# Patient Record
Sex: Female | Born: 2000 | Race: Black or African American | Hispanic: No | Marital: Single | Smoking: Never smoker
Health system: Southern US, Community
[De-identification: ages and names within clinical notes are randomized; demographics above are authoritative.]

## PROBLEM LIST (undated history)

## (undated) ENCOUNTER — Ambulatory Visit: Admission: EM | Payer: BC Managed Care – PPO | Source: Home / Self Care

## (undated) ENCOUNTER — Inpatient Hospital Stay (HOSPITAL_COMMUNITY): Payer: Self-pay

## (undated) DIAGNOSIS — A599 Trichomoniasis, unspecified: Secondary | ICD-10-CM

## (undated) DIAGNOSIS — Z789 Other specified health status: Secondary | ICD-10-CM

## (undated) HISTORY — DX: Other specified health status: Z78.9

## (undated) HISTORY — DX: Trichomoniasis, unspecified: A59.9

## (undated) HISTORY — PX: NO PAST SURGERIES: SHX2092

---
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
## 2018-07-26 ENCOUNTER — Encounter (HOSPITAL_COMMUNITY): Payer: Self-pay | Admitting: *Deleted

## 2018-07-26 ENCOUNTER — Other Ambulatory Visit: Payer: Self-pay

## 2018-07-26 ENCOUNTER — Emergency Department (HOSPITAL_COMMUNITY)
Admission: EM | Admit: 2018-07-26 | Discharge: 2018-07-27 | Disposition: A | Discharge status: Home / Self Care | Payer: BC Managed Care – PPO | Attending: Emergency Medicine | Admitting: Emergency Medicine

## 2018-07-26 DIAGNOSIS — Z20828 Contact with and (suspected) exposure to other viral communicable diseases: Secondary | ICD-10-CM | POA: Diagnosis not present

## 2018-07-26 DIAGNOSIS — J029 Acute pharyngitis, unspecified: Secondary | ICD-10-CM | POA: Insufficient documentation

## 2018-07-26 DIAGNOSIS — F1290 Cannabis use, unspecified, uncomplicated: Secondary | ICD-10-CM | POA: Diagnosis not present

## 2018-07-26 DIAGNOSIS — R51 Headache: Secondary | ICD-10-CM | POA: Diagnosis present

## 2018-07-26 DIAGNOSIS — R519 Headache, unspecified: Secondary | ICD-10-CM

## 2018-07-26 NOTE — ED Triage Notes (Signed)
Pt reports she hasn't been feeling well since last night, has had a headache and sore throat which have improved.

## 2018-07-27 LAB — SARS CORONAVIRUS 2 BY RT PCR (HOSPITAL ORDER, PERFORMED IN ~~LOC~~ HOSPITAL LAB): SARS Coronavirus 2: NEGATIVE

## 2018-07-27 MED ORDER — DEXAMETHASONE 4 MG PO TABS
10.0000 mg | ORAL_TABLET | Freq: Once | ORAL | Status: AC
  Administered 2018-07-27: 10 mg via ORAL
  Filled 2018-07-27: qty 3

## 2018-07-27 MED ORDER — KETOROLAC TROMETHAMINE 60 MG/2ML IM SOLN
30.0000 mg | Freq: Once | INTRAMUSCULAR | Status: DC
  Filled 2018-07-27: qty 2

## 2018-07-27 MED ORDER — ACETAMINOPHEN 500 MG PO TABS
1000.0000 mg | ORAL_TABLET | Freq: Once | ORAL | Status: AC
  Administered 2018-07-27: 01:00:00 1000 mg via ORAL
  Filled 2018-07-27: qty 2

## 2018-07-27 NOTE — ED Notes (Signed)
Pt unable to tolerate covid swab. Requested I stop. Refuses testing at this time.

## 2018-07-27 NOTE — ED Provider Notes (Signed)
Emergency Department Provider Note   I have reviewed the triage vital signs and the nursing notes.   HISTORY  Chief Complaint Headache   HPI Kelly Meadows is a 18 y.o. female who presents emergency room today with a headache.  Patient states that this is been on for about a day she is also had a fever and general malaise.  Has been exposed with coronavirus that she knows of.  She also had a sore throat is been coming and going.  No other associated symptoms.   No other associated or modifying symptoms.    History reviewed. No pertinent past medical history.  There are no active problems to display for this patient.   History reviewed. No pertinent surgical history.  Current Outpatient Rx  . Order #: [id number] Class: Print    Allergies Patient has no known allergies.  No family history on file.  Social History Social History   Tobacco Use  . Smoking status: Never Smoker  Substance Use Topics  . Alcohol use: No  . Drug use: Yes    Types: Marijuana    Review of Systems  All other systems negative except as documented in the HPI. All pertinent positives and negatives as reviewed in the HPI. ____________________________________________   PHYSICAL EXAM:  VITAL SIGNS: ED Triage Vitals  Enc Vitals Group     BP 07/26/18 2137 (!) 129/92     Pulse Rate 07/26/18 2137 (!) 135     Resp 07/26/18 2137 18     Temp 07/26/18 2137 (!) 101.8 F (38.8 C)     Temp Source 07/26/18 2137 Oral     SpO2 07/26/18 2137 100 %    Constitutional: Alert and oriented. Well appearing and in no acute distress. Eyes: Conjunctivae are normal. PERRL. EOMI. Head: Atraumatic. Nose: No congestion/rhinnorhea. Mouth/Throat: Mucous membranes are moist.  Oropharynx erythematous with some tonsillar exudates. Neck: No stridor.  No meningeal signs. Cervical lympahdenopathy.   Cardiovascular: tachycardic rate, regular rhythm. Good peripheral circulation. Grossly normal heart sounds.    Respiratory: Normal respiratory effort.  No retractions. Lungs CTAB. Gastrointestinal: Soft and nontender. No distention.  Musculoskeletal: No lower extremity tenderness nor edema. No gross deformities of extremities. Neurologic:  Normal speech and language. No gross focal neurologic deficits are appreciated.  Skin:  Skin is warm, dry and intact. No rash noted.  ____________________________________________   LABS (all labs ordered are listed, but only abnormal results are displayed)  Labs Reviewed  CULTURE, GROUP A STREP [facility])  SARS CORONAVIRUS 2 (HOSPITAL ORDER, [facility])  GROUP A STREP BY PCR   ____________________________________________    INITIAL IMPRESSION / ASSESSMENT AND PLAN / ED COURSE  [name] was evaluated in Emergency Department on 07/27/2018 for the symptoms described in the history of present illness. She was evaluated in the context of the global COVID-19 pandemic, which necessitated consideration that the patient might be at risk for infection with the SARS-CoV-2 virus that causes COVID-19. Institutional protocols and algorithms that pertain to the evaluation of patients at risk for COVID-19 are in a state of rapid change based on information released by regulatory bodies including the CDC and federal and state organizations. These policies and algorithms were followed during the patient's care in the ED.  Strep vs Covid. Test for both. Symptomatic care.   Persistently asymptomatic, refusing further care. VS WNL. Stable for discharge.   Pertinent labs & imaging results that were available during my care of the patient were  reviewed by me and considered in my medical decision making (see chart for details).   A medical screening exam was performed and I feel the patient has had an appropriate workup for their chief complaint at this time and likelihood of emergent condition existing is low. They have been counseled on decision,  discharge, follow up and which symptoms necessitate immediate return to the emergency department. They or their family verbally stated understanding and agreement with plan and discharged in stable condition.   ____________________________________________  FINAL CLINICAL IMPRESSION(S) / ED DIAGNOSES  Final diagnoses:  None     MEDICATIONS GIVEN DURING THIS VISIT:  Medications  ketorolac (TORADOL) injection 30 mg (30 mg Intramuscular Refused 07/27/18 0402)  acetaminophen (TYLENOL) tablet 1,000 mg (1,000 mg Oral Given 07/27/18 0047)  dexamethasone (DECADRON) tablet 10 mg (10 mg Oral Given 07/27/18 0338)     NEW OUTPATIENT MEDICATIONS STARTED DURING THIS VISIT:  New Prescriptions   No medications on file    Note:  This note was prepared with assistance of Dragon voice recognition software. Occasional wrong-word or sound-a-like substitutions may have occurred due to the inherent limitations of voice recognition software.   Tristram Milian, Barbara CowerJason, MD 07/27/18 [phone]

## 2018-07-28 LAB — CULTURE, GROUP A STREP (THRC)

## 2018-07-29 ENCOUNTER — Telehealth: Payer: Self-pay

## 2018-07-29 NOTE — Telephone Encounter (Signed)
Post ED Visit - Positive Culture Follow-up: Successful Patient Follow-Up  Culture assessed and recommendations reviewed by:  []  Elenor Quinones, Pharm.D. []  Heide Guile, Pharm.D., BCPS AQ-ID [x]  Parks Neptune, Pharm.D., BCPS []  Alycia Rossetti, Pharm.D., BCPS []  [place], Pharm.D., BCPS, AAHIVP []  Legrand Como, Pharm.D., BCPS, AAHIVP []  Salome Arnt, PharmD, BCPS []  Johnnette Gourd, PharmD, BCPS []  Hughes Better, PharmD, BCPS []  Leeroy Cha, PharmD  Positive strep culture  [x]  Patient discharged without antimicrobial prescription and treatment is now indicated []  Organism is resistant to prescribed ED discharge antimicrobial []  Patient with positive blood cultures  Changes discussed with ED provider: Lenn Sink PA New antibiotic prescription amoxicillin 500 mg PO BID x 10 days Called to [facility] [phone]  Contacted patient, date 07/29/2018, time 1210   Kelly Meadows, Carolynn Comment 07/29/2018, 12:09 PM

## 2018-07-29 NOTE — Progress Notes (Signed)
ED Antimicrobial Stewardship Positive Culture Follow Up   Kelly Meadows is an 18 y.o. female who presented to [facility] on 07/26/2018 with a chief complaint of  Chief Complaint  Patient presents with  . Headache    Recent Results (from the past 720 hour(s))  Culture, group A strep     Status: None   Collection Time: 07/26/18 11:34 PM   Specimen: Throat  Result Value Ref Range Status   Specimen Description THROAT  Final   Special Requests   Final    NONE Performed at [facility], [address], [place], [place] [address]    Culture MODERATE GROUP A STREP (S.PYOGENES) ISOLATED  Final   Report Status 07/28/2018 FINAL  Final  SARS Coronavirus 2 (CEPHEID - Performed in [place] hospital lab), Hosp Order     Status: None   Collection Time: 07/27/18  4:03 AM   Specimen: Nasopharyngeal Swab  Result Value Ref Range Status   SARS Coronavirus 2 NEGATIVE NEGATIVE Final    Comment: (NOTE) If result is NEGATIVE SARS-CoV-2 target nucleic acids are NOT DETECTED. The SARS-CoV-2 RNA is generally detectable in upper and lower  respiratory specimens during the acute phase of infection. The lowest  concentration of SARS-CoV-2 viral copies this assay can detect is 250  copies / mL. A negative result does not preclude SARS-CoV-2 infection  and should not be used as the sole basis for treatment or other  patient management decisions.  A negative result may occur with  improper specimen collection / handling, submission of specimen other  than nasopharyngeal swab, presence of viral mutation(s) within the  areas targeted by this assay, and inadequate number of viral copies  (<250 copies / mL). A negative result must be combined with clinical  observations, patient history, and epidemiological information. If result is POSITIVE SARS-CoV-2 target nucleic acids are DETECTED. The SARS-CoV-2 RNA is generally detectable in upper and lower  respiratory specimens dur ing the acute phase of  infection.  Positive  results are indicative of active infection with SARS-CoV-2.  Clinical  correlation with patient history and other diagnostic information is  necessary to determine patient infection status.  Positive results do  not rule out bacterial infection or co-infection with other viruses. If result is PRESUMPTIVE POSTIVE SARS-CoV-2 nucleic acids MAY BE PRESENT.   A presumptive positive result was obtained on the submitted specimen  and confirmed on repeat testing.  While 2019 novel coronavirus  (SARS-CoV-2) nucleic acids may be present in the submitted sample  additional confirmatory testing may be necessary for epidemiological  and / or clinical management purposes  to differentiate between  SARS-CoV-2 and other Sarbecovirus currently known to infect humans.  If clinically indicated additional testing with an alternate test  methodology [phone]) is advised. The SARS-CoV-2 RNA is generally  detectable in upper and lower respiratory sp ecimens during the acute  phase of infection. The expected result is Negative. Fact Sheet for Patients:  StrictlyIdeas.no Fact Sheet for Healthcare Providers: BankingDealers.co.za This test is not yet approved or cleared by the [place] FDA and has been authorized for detection and/or diagnosis of SARS-CoV-2 by FDA under an Emergency Use Authorization (EUA).  This EUA will remain in effect (meaning this test can be used) for the duration of the COVID-19 declaration under Section 564(b)(1) of the Act, 21 U.S.C. section 360bbb-3(b)(1), unless the authorization is terminated or revoked sooner. Performed at [facility], [place] [address] [place], [place] [address]    []   Patient discharged originally without antimicrobial agent and treatment is now indicated  New antibiotic prescription: amox 500 bid x 10 d  ED Provider:  jeff hedges,pa-c  Kelly Meadows 07/29/2018, 11:28  AM Clinical Pharmacist Monday - Friday phone -  [phone] Saturday - Sunday phone - [phone]

## 2018-08-09 ENCOUNTER — Other Ambulatory Visit: Payer: Self-pay

## 2018-08-09 ENCOUNTER — Emergency Department (HOSPITAL_COMMUNITY)
Admission: EM | Admit: 2018-08-09 | Discharge: 2018-08-09 | Disposition: A | Discharge status: Home / Self Care | Payer: BC Managed Care – PPO | Attending: Emergency Medicine | Admitting: Emergency Medicine

## 2018-08-09 DIAGNOSIS — R109 Unspecified abdominal pain: Secondary | ICD-10-CM | POA: Diagnosis present

## 2018-08-09 DIAGNOSIS — R1084 Generalized abdominal pain: Secondary | ICD-10-CM | POA: Insufficient documentation

## 2018-08-09 DIAGNOSIS — N926 Irregular menstruation, unspecified: Secondary | ICD-10-CM | POA: Diagnosis not present

## 2018-08-09 LAB — URINALYSIS, ROUTINE W REFLEX MICROSCOPIC
Bilirubin Urine: NEGATIVE
Glucose, UA: NEGATIVE mg/dL
Hgb urine dipstick: NEGATIVE
Ketones, ur: NEGATIVE mg/dL
Leukocytes,Ua: NEGATIVE
Nitrite: NEGATIVE
Protein, ur: NEGATIVE mg/dL
Specific Gravity, Urine: 1.028 (ref 1.005–1.030)
pH: 5 (ref 5.0–8.0)

## 2018-08-09 LAB — PREGNANCY, URINE: Preg Test, Ur: NEGATIVE

## 2018-08-09 NOTE — ED Provider Notes (Signed)
[facility] Provider Note   CSN: [id number] Arrival date & time: 08/09/18  1810     History   Chief Complaint Chief Complaint  Patient presents with  . Abdominal Pain    HPI Catarina Hartshornrinity A Mckiernan is a 18 y.o. female.     Bell Artemio Aly Bibby is a 18 y.o. female who is otherwise healthy, presents to the emergency department for evaluation of lower abdominal cramps and vaginal bleeding.  Patient reports that about 2 weeks ago she had a normal menstrual cycle and then started having some light bleeding again on 7/22, bleeding stopped this afternoon.  Patient was having some associated lower abdominal cramping which she reports is very similar to her typical period cramps.  She reports that at the end of May she was due for a Depo-Provera shot but chose not to continue with this medication.  She denies any vaginal discharge.  Bleeding has completely stopped.  No dysuria or urinary frequency.  No nausea, vomiting, constipation or diarrhea.  Cramping has resolved with vaginal bleeding.     No past medical history on file.  There are no active problems to display for this patient.   No past surgical history on file.   OB History   No obstetric history on file.      Home Medications    Prior to Admission medications   Not on File    Family History No family history on file.  Social History Social History   Tobacco Use  . Smoking status: Never Smoker  Substance Use Topics  . Alcohol use: No  . Drug use: Yes    Types: Marijuana     Allergies   Patient has no known allergies.   Review of Systems Review of Systems  Constitutional: Negative for chills and fever.  HENT: Negative.   Respiratory: Negative for cough and shortness of breath.   Cardiovascular: Negative for chest pain.  Gastrointestinal: Positive for abdominal pain. Negative for constipation, diarrhea, nausea and vomiting.  Genitourinary: Positive for vaginal bleeding.  Negative for dysuria, frequency, hematuria, pelvic pain and vaginal discharge.  Musculoskeletal: Negative for arthralgias and myalgias.  Skin: Negative for color change and rash.  Neurological: Negative for dizziness, syncope and light-headedness.     Physical Exam Updated Vital Signs BP 118/73 (BP Location: Right Arm)   Pulse 87   Temp 97.7 F (36.5 C) (Oral)   Resp 16   Ht 5\' 5"  (1.651 m)   Wt 68.9 kg   LMP 07/25/2018   SpO2 100%   BMI 25.29 kg/m   Physical Exam Vitals signs and nursing note reviewed.  Constitutional:      General: She is not in acute distress.    Appearance: She is well-developed and normal weight. She is not diaphoretic.  HENT:     Head: Normocephalic and atraumatic.     Mouth/Throat:     Mouth: Mucous membranes are moist.     Pharynx: Oropharynx is clear.  Eyes:     General:        Right eye: No discharge.        Left eye: No discharge.     Pupils: Pupils are equal, round, and reactive to light.  Neck:     Musculoskeletal: Neck supple.  Cardiovascular:     Rate and Rhythm: Normal rate and regular rhythm.     Heart sounds: Normal heart sounds.  Pulmonary:     Effort: Pulmonary effort is normal. No respiratory distress.  Breath sounds: Normal breath sounds. No wheezing or rales.     Comments: Respirations equal and unlabored, patient able to speak in full sentences, lungs clear to auscultation bilaterally Abdominal:     General: Abdomen is flat. Bowel sounds are normal. There is no distension.     Palpations: Abdomen is soft. There is no mass.     Tenderness: There is no abdominal tenderness. There is no guarding.     Comments: Abdomen soft, nondistended, nontender to palpation in all quadrants without guarding or peritoneal signs, no CVA tenderness  Genitourinary:    Comments: Pelvic deferred as symptoms have completely resolved Musculoskeletal:        General: No deformity.  Skin:    General: Skin is warm and dry.     Capillary Refill:  Capillary refill takes less than 2 seconds.  Neurological:     Mental Status: She is alert.     Coordination: Coordination normal.     Comments: Speech is clear, able to follow commands Moves extremities without ataxia, coordination intact  Psychiatric:        Mood and Affect: Mood normal.        Behavior: Behavior normal.      ED Treatments / Results  Labs (all labs ordered are listed, but only abnormal results are displayed) Labs Reviewed  URINALYSIS, ROUTINE W REFLEX MICROSCOPIC  PREGNANCY, URINE    EKG None  Radiology No results found.  Procedures Procedures (including critical care time)  Medications Ordered in ED Medications - No data to display   Initial Impression / Assessment and Plan / ED Course  I have reviewed the triage vital signs and the nursing notes.  Pertinent labs & imaging results that were available during my care of the patient were reviewed by me and considered in my medical decision making (see chart for details).  Patient presents after she had recurrent episode of vaginal bleeding about 2 weeks after her menstrual cycle, she had light bleeding for 3 days with associated lower abdominal cramping which is typical for her periods.  Patient recently missed Depo-Provera injection at the end of May, she decided she wanted to discontinue this medication and I suspect this is the cause for her irregular periods.  Symptoms have completely resolved as bleeding stopped earlier today, she has normal vitals and no focal tenderness on exam.  She is not having any other associated symptoms.  Urine pregnancy and urinalysis are negative today.  Feel patient is stable for discharge home without further evaluation, will have her follow-up with her OB/GYN to discuss other options for contraception.  Patient expresses understanding and agreement with this plan.  Discharged home in good condition.  Final Clinical Impressions(s) / ED Diagnoses   Final diagnoses:   Abdominal cramps  Irregular periods    ED Discharge Orders    None       Janet Berlin 08/09/18 2008    Quintella Reichert, MD 08/10/18 1056

## 2018-08-09 NOTE — ED Notes (Signed)
Patient verbalizes understanding of discharge instructions. Opportunity for questioning and answers were provided. Armband removed by staff, pt discharged from ED ambulatory.   

## 2018-08-09 NOTE — ED Triage Notes (Signed)
Pt c/o stomach cramps.  Stated she had a period 2 weeks and started bleeding again 08/06/18.  No active bleeding per pt at this time

## 2018-08-09 NOTE — ED Notes (Signed)
P:t refusing discharge vitals or to sign electronic discharge. Standing at nurses desk stating "I just want to go."

## 2018-08-09 NOTE — Discharge Instructions (Signed)
After you have stopped Depo-Provera this can cause some irregular periods and increased cramping.  Please follow-up with your OB/GYN clinic to discuss continuing Depo-Provera versus other options for birth control.  Your pregnancy test was negative today and I so no evidence of UTI.  Return for new or worsening symptoms.

## 2018-10-29 NOTE — Telephone Encounter (Signed)
This encounter was created in error - please disregard.

## 2019-06-30 ENCOUNTER — Other Ambulatory Visit: Payer: Self-pay

## 2019-06-30 ENCOUNTER — Encounter (HOSPITAL_COMMUNITY): Payer: Self-pay | Admitting: Emergency Medicine

## 2019-06-30 ENCOUNTER — Emergency Department (HOSPITAL_COMMUNITY)
Admission: EM | Admit: 2019-06-30 | Discharge: 2019-07-01 | Discharge status: Against Medical Advice | Payer: BC Managed Care – PPO | Attending: Emergency Medicine | Admitting: Emergency Medicine

## 2019-06-30 DIAGNOSIS — Z041 Encounter for examination and observation following transport accident: Secondary | ICD-10-CM | POA: Insufficient documentation

## 2019-06-30 DIAGNOSIS — Z5321 Procedure and treatment not carried out due to patient leaving prior to being seen by health care provider: Secondary | ICD-10-CM | POA: Diagnosis not present
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
## 2019-07-01 ENCOUNTER — Encounter (HOSPITAL_COMMUNITY): Payer: Self-pay

## 2019-07-01 ENCOUNTER — Other Ambulatory Visit: Payer: Self-pay

## 2019-07-01 ENCOUNTER — Ambulatory Visit (INDEPENDENT_AMBULATORY_CARE_PROVIDER_SITE_OTHER)
Admission: RE | Admit: 2019-07-01 | Discharge: 2019-07-01 | Disposition: A | Discharge status: Home / Self Care | Payer: BC Managed Care – PPO | Source: Ambulatory Visit

## 2019-07-01 VITALS — BP 120/69 | HR 88 | Temp 99.1°F | Resp 16

## 2019-07-01 DIAGNOSIS — M546 Pain in thoracic spine: Secondary | ICD-10-CM

## 2019-07-01 DIAGNOSIS — M542 Cervicalgia: Secondary | ICD-10-CM

## 2019-07-01 DIAGNOSIS — M25572 Pain in left ankle and joints of left foot: Secondary | ICD-10-CM

## 2019-07-01 MED ORDER — TIZANIDINE HCL 4 MG PO TABS: 4.0000 mg | ORAL_TABLET | Freq: Three times a day (TID) | ORAL | 0 refills | Status: DC | PRN

## 2019-07-01 MED ORDER — MELOXICAM 7.5 MG PO TABS: 7.5000 mg | ORAL_TABLET | Freq: Every day | ORAL | 0 refills | Status: DC
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
## 2019-08-24 ENCOUNTER — Emergency Department (HOSPITAL_COMMUNITY)
Admission: EM | Admit: 2019-08-24 | Discharge: 2019-08-25 | Disposition: A | Discharge status: Home / Self Care | Payer: BC Managed Care – PPO

## 2020-02-18 ENCOUNTER — Ambulatory Visit: Payer: BC Managed Care – PPO | Admitting: Physician Assistant

## 2020-03-31 ENCOUNTER — Encounter: Payer: Self-pay | Admitting: Physician Assistant

## 2020-03-31 ENCOUNTER — Ambulatory Visit (INDEPENDENT_AMBULATORY_CARE_PROVIDER_SITE_OTHER): Payer: BC Managed Care – PPO | Admitting: Physician Assistant

## 2020-03-31 VITALS — BP 102/70 | HR 72 | Ht 65.75 in | Wt 146.6 lb

## 2020-03-31 DIAGNOSIS — K644 Residual hemorrhoidal skin tags: Secondary | ICD-10-CM | POA: Diagnosis not present

## 2020-03-31 DIAGNOSIS — K640 First degree hemorrhoids: Secondary | ICD-10-CM

## 2020-03-31 DIAGNOSIS — K625 Hemorrhage of anus and rectum: Secondary | ICD-10-CM

## 2020-03-31 NOTE — Patient Instructions (Signed)
Please purchase over the counter hydrocortisone cream to use rectally twice daily x 2 weeks.   RECTAL CARE INSTRUCTIONS:  1. Sitz Baths twice a day for 10 minutes each. 2. Thoroughly clean and dry the rectum. 3. Put Tucks pad against the rectum at night. 4. Clean the rectum with Balenol lotion after each bowel movement.  Call if your symptoms have not improved and your bleeding persists.   Normal BMI (Body Mass Index- based on height and weight) is between 19 and 25. Your BMI today is Body mass index is 23.84 kg/m. Marland Kitchen Please consider follow up  regarding your BMI with your Primary Care Provider.
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
## 2020-05-07 NOTE — Progress Notes (Signed)
Reviewed and agree with documentation and assessment and plan. K. Veena Kamla Skilton , MD   

## 2020-07-08 ENCOUNTER — Encounter (HOSPITAL_COMMUNITY): Payer: Self-pay

## 2020-07-08 ENCOUNTER — Other Ambulatory Visit: Payer: Self-pay

## 2020-07-08 ENCOUNTER — Emergency Department (HOSPITAL_COMMUNITY)
Admission: EM | Admit: 2020-07-08 | Discharge: 2020-07-09 | Disposition: A | Discharge status: Home / Self Care | Payer: BC Managed Care – PPO | Attending: Emergency Medicine | Admitting: Emergency Medicine

## 2020-07-08 DIAGNOSIS — U071 COVID-19: Secondary | ICD-10-CM | POA: Diagnosis not present

## 2020-07-08 DIAGNOSIS — R0981 Nasal congestion: Secondary | ICD-10-CM

## 2020-07-08 DIAGNOSIS — R059 Cough, unspecified: Secondary | ICD-10-CM | POA: Diagnosis present

## 2020-07-08 NOTE — ED Triage Notes (Signed)
Pt reports congestion x2 days and reports coughing up some mucus today with a little blood in it. Pt denies N/V and abdominal pain.

## 2020-07-09 ENCOUNTER — Emergency Department (HOSPITAL_COMMUNITY): Payer: BC Managed Care – PPO

## 2020-07-09 LAB — RESP PANEL BY RT-PCR (FLU A&B, COVID) ARPGX2
Influenza A by PCR: NEGATIVE
Influenza B by PCR: NEGATIVE
SARS Coronavirus 2 by RT PCR: POSITIVE — AB

## 2020-07-09 NOTE — Discharge Instructions (Addendum)
COVID test should result next few hours.  You will be contacted if positive.  Results will also update into my chart. Can use over the counter medications and/or nasal spray for congestion. Follow-up with your primary care doctor. Return here for new concerns.

## 2020-07-09 NOTE — ED Provider Notes (Signed)
[facility]-EMERGENCY DEPT Provider Note   CSN: [id number] Arrival date & time: 07/08/20  2236     History Chief Complaint  Patient presents with   Cough    Kelly Meadows is a 20 y.o. female.  The history is provided by the patient and medical records.  Cough  19 y.o. F presenting to ED for bloody mucus.  Patient states she has had a lot of nasal congestion over the past 2 days, now having postnasal drip.  States today she was clearing her throat and some of the mucus that came up had a little bit of blood in it.  She is not actively coughing and has not been coughing up any blood.  She denies any chest pain or shortness of breath.  She has not had any fevers.  She denies any known COVID exposures.  History reviewed. No pertinent past medical history.  There are no problems to display for this patient.   History reviewed. No pertinent surgical history.   OB History   No obstetric history on file.     History reviewed. No pertinent family history.  Social History   Tobacco Use   Smoking status: Never   Smokeless tobacco: Never  Vaping Use   Vaping Use: Some days  Substance Use Topics   Alcohol use: Yes    Alcohol/week: 1.0 standard drink    Types: 1 Standard drinks or equivalent per week   Drug use: Not Currently    Types: Marijuana    Comment: Stopped approx 1-2 months ago (03/31/20)    Home Medications Prior to Admission medications   Not on File    Allergies    Patient has no known allergies.  Review of Systems   Review of Systems  Respiratory:  Positive for cough.   All other systems reviewed and are negative.  Physical Exam Updated Vital Signs BP 114/82 (BP Location: Right Arm)   Pulse 70   Temp 98 F (36.7 C) (Oral)   Resp 18   Ht 5\' 5"  (1.651 m)   Wt 68 kg   LMP 06/22/2020   SpO2 96%   BMI 24.96 kg/m   Physical Exam Vitals and nursing note reviewed.  Constitutional:      Appearance: She is well-developed.      Comments: Sounds congested  HENT:     Head: Normocephalic and atraumatic.     Right Ear: Tympanic membrane and ear canal normal.     Left Ear: Tympanic membrane and ear canal normal.     Nose: Mucosal edema, congestion and rhinorrhea present. Rhinorrhea is clear.     Mouth/Throat:     Comments: PND noted, no gross blood in posterior oropharynx Eyes:     Conjunctiva/sclera: Conjunctivae normal.     Pupils: Pupils are equal, round, and reactive to light.  Cardiovascular:     Rate and Rhythm: Normal rate and regular rhythm.     Heart sounds: Normal heart sounds.  Pulmonary:     Effort: Pulmonary effort is normal.     Breath sounds: Normal breath sounds. No wheezing or rhonchi.     Comments: Lungs CTAB Abdominal:     General: Bowel sounds are normal.     Palpations: Abdomen is soft.  Musculoskeletal:        General: Normal range of motion.     Cervical back: Normal range of motion.  Skin:    General: Skin is warm and dry.  Neurological:  Mental Status: She is alert and oriented to person, place, and time.    ED Results / Procedures / Treatments   Labs (all labs ordered are listed, but only abnormal results are displayed) Labs Reviewed  RESP PANEL BY RT-PCR (FLU A&B, COVID) [id number]    EKG None  Radiology DG Chest 2 View  Result Date: 07/09/2020 CLINICAL DATA:  Congestion for 2 days. EXAM: CHEST - 2 VIEW COMPARISON:  None. FINDINGS: The cardiomediastinal contours are normal. The lungs are clear. Pulmonary vasculature is normal. No consolidation, pleural effusion, or pneumothorax. No acute osseous abnormalities are seen. IMPRESSION: Negative radiographs of the chest. Electronically Signed   By: Narda Rutherford M.D.   On: 07/09/2020 00:57    Procedures Procedures   Medications Ordered in ED Medications - No data to display  ED Course  I have reviewed the triage vital signs and the nursing notes.  Pertinent labs & imaging results that were available during my care of  the patient were reviewed by me and considered in my medical decision making (see chart for details).    MDM Rules/Calculators/A&P  20 year old female here with bloody mucus.  Has had a lot of nasal congestion of the past few days, now with postnasal drip.  She clears her throat today and mucus noted to have a little bit of blood in it.  She denies any cough, chest pain, or shortness of breath.  No hemoptysis.  She is afebrile and nontoxic in appearance here.  Vitals are stable.  On exam, she does have significant mucosal edema, irritation, and clear rhinorrhea.  There is some observed postnasal drip but this appears grossly nonbloody.  Her lungs are clear without any wheezes or rhonchi.  Suspect bloody mucus is likely due to inflammation in the nasal passages.  I have low suspicion for acute PE.  Her chest x-ray is normal.  We will send COVID test.  Encouraged symptomatic care at home.  Close follow-up with PCP.  Return here for new concerns.  Final Clinical Impression(s) / ED Diagnoses Final diagnoses:  Nasal congestion    Rx / DC Orders ED Discharge Orders     None        Garlon Hatchet, PA-C 07/09/20 0151    Mesner, Barbara Cower, MD 07/09/20 [phone]

## 2020-07-23 ENCOUNTER — Other Ambulatory Visit: Payer: Self-pay

## 2020-07-23 ENCOUNTER — Emergency Department (HOSPITAL_COMMUNITY)
Admission: EM | Admit: 2020-07-23 | Discharge: 2020-07-23 | Disposition: A | Discharge status: Home / Self Care | Payer: BC Managed Care – PPO | Attending: Emergency Medicine | Admitting: Emergency Medicine

## 2020-07-23 DIAGNOSIS — B9689 Other specified bacterial agents as the cause of diseases classified elsewhere: Secondary | ICD-10-CM | POA: Insufficient documentation

## 2020-07-23 DIAGNOSIS — N760 Acute vaginitis: Secondary | ICD-10-CM | POA: Diagnosis not present

## 2020-07-23 DIAGNOSIS — R350 Frequency of micturition: Secondary | ICD-10-CM | POA: Diagnosis present

## 2020-07-23 DIAGNOSIS — A599 Trichomoniasis, unspecified: Secondary | ICD-10-CM | POA: Diagnosis not present

## 2020-07-23 LAB — URINALYSIS, ROUTINE W REFLEX MICROSCOPIC
Bilirubin Urine: NEGATIVE
Glucose, UA: NEGATIVE mg/dL
Hgb urine dipstick: NEGATIVE
Ketones, ur: NEGATIVE mg/dL
Nitrite: NEGATIVE
Protein, ur: NEGATIVE mg/dL
Specific Gravity, Urine: 1.023 (ref 1.005–1.030)
pH: 6 (ref 5.0–8.0)

## 2020-07-23 LAB — WET PREP, GENITAL
Sperm: NONE SEEN
Yeast Wet Prep HPF POC: NONE SEEN

## 2020-07-23 LAB — PREGNANCY, URINE: Preg Test, Ur: NEGATIVE

## 2020-07-23 MED ORDER — METRONIDAZOLE 500 MG PO TABS: 500.0000 mg | ORAL_TABLET | Freq: Two times a day (BID) | ORAL | 0 refills | Status: DC

## 2020-07-23 MED ORDER — LIDOCAINE HCL (PF) 1 % IJ SOLN
1.0000 mL | Freq: Once | INTRAMUSCULAR | Status: AC
  Administered 2020-07-23: 1 mL
  Filled 2020-07-23: qty 30

## 2020-07-23 MED ORDER — DOXYCYCLINE HYCLATE 100 MG PO TABS: 100.0000 mg | ORAL_TABLET | Freq: Two times a day (BID) | ORAL | 0 refills | Status: AC

## 2020-07-23 MED ORDER — CEFTRIAXONE SODIUM 1 G IJ SOLR
500.0000 mg | Freq: Once | INTRAMUSCULAR | Status: AC
Start: 2020-07-23 — End: 2020-07-23
  Administered 2020-07-23: 500 mg via INTRAMUSCULAR
  Filled 2020-07-23: qty 10

## 2020-07-23 NOTE — ED Triage Notes (Signed)
Pt came in with c/o one episode of hematuria and urinary frequency. Pt also wants to be tested for STD. Pt is denying discharge. Pt endorses random suprapubic abdominal pain. Symptoms started tonight

## 2020-07-23 NOTE — Discharge Instructions (Addendum)
Today your diagnosed with bacterial vaginosis  and Trichomoniasis.  You received a prescription for metronidazole also known as Flagyl. It is very important that you do not consume any alcohol while taking this medication as it will cause you to become violently ill.  You may have diarrhea from the antibiotics.  It is very important that you continue to take the antibiotics even if you get diarrhea unless a medical professional tells you that you may stop taking them.  If you stop too early the bacteria you are being treated for will become stronger and you may need different, more powerful antibiotics that have more side effects and worsening diarrhea.  Please stay well hydrated and consider probiotics as they may decrease the severity of your diarrhea.  Please be aware that if you take any hormonal contraception (birth control pills, nexplanon, the ring, etc) that your birth control will not work while you are taking antibiotics and you need to use back up protection as directed on the birth control medication information insert.   Today you have been treated for gonorrhea and chlamydia.  The test to determine if you have these will take a few days. They will only call you if your tests come back positive, no news is good news. In the result that your tests are positive you have already been treated.  You decided not to have HIV or syphilis testing done today.  If you change your mind you can go to the [facility] department to receive this testing.  Please do not have any sexual activity for the next two weeks.  After that please make sure that you always use a condom every time you have sex.  If you have any new or concerning symptoms please seek additional medical care and evaluation.     Please follow-up with your primary care provider or OB/GYN after completing your medication.  Contact a health care provider if: You still have symptoms after you finish your medicine. You develop pain  in your abdomen. You have pain when you urinate. You have bleeding after sex. You develop a rash. You feel nauseous or you vomit. You plan to become pregnant or think you may be pregnant.

## 2020-07-23 NOTE — ED Provider Notes (Signed)
[facility]-EMERGENCY DEPT Provider Note   CSN: [id number] Arrival date & time: 07/23/20  0530     History Chief Complaint  Patient presents with   Urinary Frequency   Exposure to STD    Kelly Meadows is a 19 y.o. female no pertinent past medical history.  Patient presents emerged department with a chief complaint of urinary frequency, abdominal pain, and concern for exposure to STI.  Patient reports that she 1 female partner.  Patient is unsure if they are mutually monogamous.  Patient is not on any birth control.  Patient does not use condoms during sexual intercourse.  Patient is concerned that she may have developed a sexually transmitted infection.  Patient endorses urinary frequency, 1 episode of hematuria last night, and vaginal pruritus.  Patient denies any genital sores or lesions, vaginal discharge, vaginal pain, vaginal bleeding.    Patient endorses intermittent abdominal pain.  Patient reports this is been going on for "a few weeks."  Patient reports that pain moves around her abdomen and is not in the same place.  Patient describes pain as sharp.  Patient denies any aggravating or alleviating factors.  States the pain comes on randomly.  Patient denies any abdominal pain at present.  Patient denies any nausea, vomiting, diarrhea, constipation, abdominal distention, blood in stool, melena, fevers, chills.  LMP 7/3.  G0.  No history of abdominal surgeries.   Urinary Frequency Associated symptoms include abdominal pain. Pertinent negatives include no chest pain, no headaches and no shortness of breath.  Exposure to STD Associated symptoms include abdominal pain. Pertinent negatives include no chest pain, no headaches and no shortness of breath.      No past medical history on file.  There are no problems to display for this patient.   No past surgical history on file.   OB History   No obstetric history on file.     No family history on  file.  Social History   Tobacco Use   Smoking status: Never   Smokeless tobacco: Never  Vaping Use   Vaping Use: Some days  Substance Use Topics   Alcohol use: Yes    Alcohol/week: 1.0 standard drink    Types: 1 Standard drinks or equivalent per week   Drug use: Not Currently    Types: Marijuana    Comment: Stopped approx 1-2 months ago (03/31/20)    Home Medications Prior to Admission medications   Not on File    Allergies    Patient has no known allergies.  Review of Systems   Review of Systems  Constitutional:  Negative for chills and fever.  Respiratory:  Negative for shortness of breath.   Cardiovascular:  Negative for chest pain.  Gastrointestinal:  Positive for abdominal pain. Negative for abdominal distention, anal bleeding, blood in stool, constipation, diarrhea, nausea, rectal pain and vomiting.  Genitourinary:  Positive for frequency, hematuria and vaginal bleeding. Negative for difficulty urinating, dysuria, flank pain, genital sores, pelvic pain, vaginal discharge and vaginal pain.  Musculoskeletal:  Negative for back pain and neck pain.  Skin:  Negative for color change, pallor, rash and wound.  Allergic/Immunologic: Negative for immunocompromised state.  Neurological:  Negative for dizziness, syncope, light-headedness and headaches.  Psychiatric/Behavioral:  Negative for confusion.    Physical Exam Updated Vital Signs BP (!) 106/91 (BP Location: Left Arm)   Pulse 99   Temp 97.8 F (36.6 C) (Oral)   Resp 16   Ht 5\' 6"  (1.676 m)  Wt 68 kg   SpO2 97%   BMI 24.21 kg/m   Physical Exam Vitals and nursing note reviewed. Exam conducted with a chaperone present (Female nurse tech present as chaperone).  Constitutional:      General: She is not in acute distress.    Appearance: She is not ill-appearing, toxic-appearing or diaphoretic.  HENT:     Head: Normocephalic.  Eyes:     General: No scleral icterus.       Right eye: No discharge.        Left  eye: No discharge.  Cardiovascular:     Rate and Rhythm: Normal rate.  Pulmonary:     Effort: Pulmonary effort is normal.  Abdominal:     General: Abdomen is flat. Bowel sounds are normal. There is no distension. There are no signs of injury.     Palpations: Abdomen is soft. There is no mass or pulsatile mass.     Tenderness: There is no abdominal tenderness. There is no right CVA tenderness, left CVA tenderness, guarding or rebound.     Hernia: There is no hernia in the umbilical area, ventral area, left inguinal area or right inguinal area.  Genitourinary:    General: Normal vulva.     Exam position: Lithotomy position.     Pubic Area: No rash or pubic lice.      Tanner stage (genital): 5.     Labia:        Right: No rash, tenderness, lesion or injury.        Left: No rash, tenderness, lesion or injury.      Vagina: No signs of injury and foreign body. Vaginal discharge and bleeding present. No erythema, tenderness, lesions or prolapsed vaginal walls.     Cervix: Discharge and erythema present. No cervical motion tenderness, friability, lesion, cervical bleeding or eversion.     Uterus: Not enlarged.      Adnexa: Right adnexa normal and left adnexa normal.     Comments: Cervical os closed.  Scant bleeding and clear discharge noted to vaginal vault and coming from cervical os.  No cervical motion tenderness. Lymphadenopathy:     Lower Body: No right inguinal adenopathy. Left inguinal adenopathy present.  Skin:    General: Skin is warm and dry.  Neurological:     General: No focal deficit present.     Mental Status: She is alert.  Psychiatric:        Behavior: Behavior is cooperative.    ED Results / Procedures / Treatments   Labs (all labs ordered are listed, but only abnormal results are displayed) Labs Reviewed  WET PREP, GENITAL - Abnormal; Notable for the following components:      Result Value   Trich, Wet Prep PRESENT (*)    Clue Cells Wet Prep HPF POC PRESENT (*)     WBC, Wet Prep HPF POC MODERATE (*)    All other components within normal limits  URINALYSIS, ROUTINE W REFLEX MICROSCOPIC - Abnormal; Notable for the following components:   Leukocytes,Ua TRACE (*)    Bacteria, UA FEW (*)    All other components within normal limits  URINE CULTURE  PREGNANCY, URINE  GC/CHLAMYDIA PROBE AMP ([place]) NOT AT [facility]    EKG None  Radiology No results found.  Procedures Procedures   Medications Ordered in ED Medications  cefTRIAXone (ROCEPHIN) injection 500 mg (500 mg Intramuscular Given 07/23/20 0814)  lidocaine (PF) (XYLOCAINE) 1 % injection 1 mL (1 mL Other Given 07/23/20  8295)    ED Course  I have reviewed the triage vital signs and the nursing notes.  Pertinent labs & imaging results that were available during my care of the patient were reviewed by me and considered in my medical decision making (see chart for details).    MDM Rules/Calculators/A&P                          Alert 20 year old female no acute distress, nontoxic-appearing.  Patient presents emerged department with concern for exposure to STI and abdominal pain.  Patient reports that abdominal pain has been present over the last few weeks.  Pain is intermittent.  Pain moves locations.  Patient denies any pain at present.  On physical exam abdomen soft, nondistended, nontender.  No rebound tenderness, guarding, negative psoas sign, no tenderness over McBurney's point, negative CVA tenderness.    Patient has no cervical motion tenderness.  Scant bleeding and clear discharge noted to vaginal vault and from cervical os.  Pregnancy test negative.  Urinalysis shows bacteria few, nitrite negative, leukocyte trace.  We will send urine for culture and defer any treatment for UTI at this time. Gonorrhea chlamydia testing obtained.  Patient elects to be treated empirically for gonorrhea chlamydia.  Patient will receive ceftriaxone and go home on 7-day course of doxycycline. Wet prep positive  for trichomonas and clue cells.  Will prescribe patient 7-day course of Flagyl. Patient defers treatment for HIV and syphilis testing at this time.  Patient given strict return precautions.  Patient expressed understanding of instructions and is agreeable with this plan.  Final Clinical Impression(s) / ED Diagnoses Final diagnoses:  BV (bacterial vaginosis)  Trichomoniasis    Rx / DC Orders ED Discharge Orders          Ordered    doxycycline (VIBRA-TABS) 100 MG tablet  2 times daily        07/23/20 0745    metroNIDAZOLE (FLAGYL) 500 MG tablet  2 times daily        07/23/20 0904             Haskel Schroeder, PA-C 07/23/20 6213    Arby Barrette, MD 08/01/20 [phone]

## 2020-07-25 LAB — GC/CHLAMYDIA PROBE AMP (~~LOC~~) NOT AT ARMC
Chlamydia: NEGATIVE
Comment: NEGATIVE
Comment: NORMAL
Neisseria Gonorrhea: NEGATIVE

## 2020-09-07 ENCOUNTER — Emergency Department (HOSPITAL_COMMUNITY)
Admission: EM | Admit: 2020-09-07 | Discharge: 2020-09-07 | Disposition: A | Discharge status: Home / Self Care | Payer: BC Managed Care – PPO | Attending: Emergency Medicine | Admitting: Emergency Medicine

## 2020-09-07 ENCOUNTER — Encounter (HOSPITAL_COMMUNITY): Payer: Self-pay

## 2020-09-07 ENCOUNTER — Other Ambulatory Visit: Payer: Self-pay

## 2020-09-07 ENCOUNTER — Emergency Department (HOSPITAL_COMMUNITY): Payer: BC Managed Care – PPO

## 2020-09-07 DIAGNOSIS — R079 Chest pain, unspecified: Secondary | ICD-10-CM

## 2020-09-07 DIAGNOSIS — N9489 Other specified conditions associated with female genital organs and menstrual cycle: Secondary | ICD-10-CM | POA: Insufficient documentation

## 2020-09-07 DIAGNOSIS — A599 Trichomoniasis, unspecified: Secondary | ICD-10-CM | POA: Diagnosis not present

## 2020-09-07 LAB — COMPREHENSIVE METABOLIC PANEL
ALT: 16 U/L (ref 0–44)
AST: 17 U/L (ref 15–41)
Albumin: 4.1 g/dL (ref 3.5–5.0)
Alkaline Phosphatase: 73 U/L (ref 38–126)
Anion gap: 7 (ref 5–15)
BUN: 13 mg/dL (ref 6–20)
CO2: 23 mmol/L (ref 22–32)
Calcium: 9.1 mg/dL (ref 8.9–10.3)
Chloride: 106 mmol/L (ref 98–111)
Creatinine, Ser: 0.66 mg/dL (ref 0.44–1.00)
GFR, Estimated: >60 mL/min (ref >60)
Glucose, Bld: 82 mg/dL (ref 70–99)
Potassium: 4 mmol/L (ref 3.5–5.1)
Sodium: 136 mmol/L (ref 135–145)
Total Bilirubin: 0.6 mg/dL (ref 0.3–1.2)
Total Protein: 7.3 g/dL (ref 6.5–8.1)

## 2020-09-07 LAB — RAPID HIV SCREEN (HIV 1/2 AB+AG)
HIV 1/2 Antibodies: NONREACTIVE
HIV-1 P24 Antigen - HIV24: NONREACTIVE

## 2020-09-07 LAB — CBC WITH DIFFERENTIAL/PLATELET
Abs Immature Granulocytes: 0.01 10*3/uL (ref 0.00–0.07)
Basophils Absolute: 0 10*3/uL (ref 0.0–0.1)
Basophils Relative: 0 %
Eosinophils Absolute: 0.1 10*3/uL (ref 0.0–0.5)
Eosinophils Relative: 1 %
HCT: 38 % (ref 36.0–46.0)
Hemoglobin: 12.3 g/dL (ref 12.0–15.0)
Immature Granulocytes: 0 %
Lymphocytes Relative: 31 %
Lymphs Abs: 2.5 10*3/uL (ref 0.7–4.0)
MCH: 28.1 pg (ref 26.0–34.0)
MCHC: 32.4 g/dL (ref 30.0–36.0)
MCV: 87 fL (ref 80.0–100.0)
Monocytes Absolute: 0.5 10*3/uL (ref 0.1–1.0)
Monocytes Relative: 7 %
Neutro Abs: 4.9 10*3/uL (ref 1.7–7.7)
Neutrophils Relative %: 61 %
Platelets: 182 10*3/uL (ref 150–400)
RBC: 4.37 MIL/uL (ref 3.87–5.11)
RDW: 13.2 % (ref 11.5–15.5)
WBC: 8 10*3/uL (ref 4.0–10.5)
nRBC: 0 % (ref 0.0–0.2)

## 2020-09-07 LAB — URINALYSIS, ROUTINE W REFLEX MICROSCOPIC
Bilirubin Urine: NEGATIVE
Glucose, UA: NEGATIVE mg/dL
Hgb urine dipstick: NEGATIVE
Ketones, ur: NEGATIVE mg/dL
Nitrite: NEGATIVE
Protein, ur: NEGATIVE mg/dL
Specific Gravity, Urine: 1.02 (ref 1.005–1.030)
pH: 5 (ref 5.0–8.0)

## 2020-09-07 LAB — I-STAT BETA HCG BLOOD, ED (MC, WL, AP ONLY): I-stat hCG, quantitative: 22.7 m[IU]/mL — ABNORMAL HIGH (ref <5)

## 2020-09-07 LAB — WET PREP, GENITAL
Sperm: NONE SEEN
Yeast Wet Prep HPF POC: NONE SEEN

## 2020-09-07 LAB — GC/CHLAMYDIA PROBE AMP (~~LOC~~) NOT AT ARMC
Chlamydia: NEGATIVE
Comment: NEGATIVE
Comment: NORMAL
Neisseria Gonorrhea: NEGATIVE

## 2020-09-07 LAB — PREGNANCY, URINE: Preg Test, Ur: NEGATIVE

## 2020-09-07 MED ORDER — METRONIDAZOLE 500 MG PO TABS: 500.0000 mg | ORAL_TABLET | Freq: Two times a day (BID) | ORAL | 0 refills | Status: AC

## 2020-09-07 NOTE — Discharge Instructions (Addendum)
You were evaluated in the Emergency Department and after careful evaluation, we did not find any emergent condition requiring admission or further testing in the hospital.  Your exam/testing today was overall reassuring.  Please take the metronidazole medication prescribed to treat for this.  Your EKG and chest x-ray were normal.  Please return to the Emergency Department if you experience any worsening of your condition.  Thank you for allowing [place] to be a part of your care.

## 2020-09-07 NOTE — ED Notes (Signed)
Pt performed self swab to obtain specimens.

## 2020-09-07 NOTE — ED Provider Notes (Signed)
[facility]-EMERGENCY DEPT [facility] Emergency Department Provider Note MRN:  [id number]  Arrival date & time: 09/07/20     Chief Complaint   Chest Pain and Exposure to STD   History of Present Illness   Kelly Meadows is a 20 y.o. year-old female with no pertinent past medical history presenting to the ED with chief complaint of chest pain.  Location: Central chest Duration: 3 months Onset: Sudden Timing: Intermittent Description: Sharp Severity: Mild Exacerbating/Alleviating Factors: None Associated Symptoms: None Pertinent Negatives: Denies dizziness or diaphoresis, no nausea vomiting, no shortness of breath, no leg pain or swelling  Additional History: Also having some mild vaginal discharge and wants to be tested for STDs.  Review of Systems  A complete 10 system review of systems was obtained and all systems are negative except as noted in the HPI and PMH.   Patient's Health History   History reviewed. No pertinent past medical history.  History reviewed. No pertinent surgical history.  History reviewed. No pertinent family history.  Social History   Socioeconomic History   Marital status: Single    Spouse name: Not on file   Number of children: Not on file   Years of education: Not on file   Highest education level: Not on file  Occupational History   Not on file  Tobacco Use   Smoking status: Never   Smokeless tobacco: Never  Vaping Use   Vaping Use: Some days  Substance and Sexual Activity   Alcohol use: Yes    Alcohol/week: 1.0 standard drink    Types: 1 Standard drinks or equivalent per week   Drug use: Not Currently    Types: Marijuana    Comment: Stopped approx 1-2 months ago (03/31/20)   Sexual activity: Not Currently  Other Topics Concern   Not on file  Social History Narrative   Not on file   Social Determinants of Health   Financial Resource Strain: Not on file  Food Insecurity: Not on file  Transportation Needs: Not on file  Physical  Activity: Not on file  Stress: Not on file  Social Connections: Not on file  Intimate Partner Violence: Not on file     Physical Exam   Vitals:   09/07/20 0306 09/07/20 0400  BP: 101/61 107/73  Pulse: 80 96  Resp: 16 20  Temp:  97.7 F (36.5 C)  SpO2: 100% 98%    CONSTITUTIONAL: Well-appearing, NAD NEURO:  Alert and oriented x 3, no focal deficits EYES:  eyes equal and reactive ENT/NECK:  no LAD, no JVD CARDIO: Regular rate, well-perfused, normal S1 and S2 PULM:  CTAB no wheezing or rhonchi GI/GU:  normal bowel sounds, non-distended, non-tender MSK/SPINE:  No gross deformities, no edema SKIN:  no rash, atraumatic PSYCH:  Appropriate speech and behavior  *Additional and/or pertinent findings included in MDM below  Diagnostic and Interventional Summary    EKG Interpretation  Date/Time:  Wednesday September 07 2020 02:08:03 EDT Ventricular Rate:  87 PR Interval:  155 QRS Duration: 89 QT Interval:  378 QTC Calculation: 455 R Axis:   95 Text Interpretation: Sinus rhythm Probable left atrial enlargement Borderline right axis deviation ST elev, probable normal early repol pattern 12 Lead; Mason-Likar Confirmed by Kennis Carina [phone]) on 09/07/2020 3:45:06 AM       Labs Reviewed  WET PREP, GENITAL - Abnormal; Notable for the following components:      Result Value   Trich, Wet Prep PRESENT (*)    Clue Cells Wet Prep  HPF POC PRESENT (*)    WBC, Wet Prep HPF POC FEW (*)    All other components within normal limits  URINALYSIS, ROUTINE W REFLEX MICROSCOPIC - Abnormal; Notable for the following components:   Leukocytes,Ua SMALL (*)    Bacteria, UA RARE (*)    All other components within normal limits  I-STAT BETA HCG BLOOD, ED ([facility], [facility], [facility] ONLY) - Abnormal; Notable for the following components:   I-stat hCG, quantitative 22.7 (*)    All other components within normal limits  CBC WITH DIFFERENTIAL/PLATELET  COMPREHENSIVE METABOLIC PANEL  RAPID HIV SCREEN (HIV 1/2 AB+AG)   PREGNANCY, URINE  GC/CHLAMYDIA PROBE AMP ([place]) NOT AT [facility]    DG Chest 2 View  Final Result      Medications - No data to display   Procedures  /  Critical Care Procedures  ED Course and Medical Decision Making  I have reviewed the triage vital signs, the nursing notes, and pertinent available records from the EMR.  Listed above are laboratory and imaging tests that I personally ordered, reviewed, and interpreted and then considered in my medical decision making (see below for details).  Chest pain for 3 months, doubt emergent process, favored to be MSK.  EKG reassuring, chest x-ray normal.  PERC negative.  Patient self swabbed for STDs, positive for trichomonas.  Offered empiric treatment for GC chlamydia, declined.  Appropriate for discharge.       Elmer Sow. Pilar Plate, MD [facility] Emergency Medicine [facility] [email or web address]  Final Clinical Impressions(s) / ED Diagnoses     ICD-10-CM   1. Trichomonal infection  A59.9     2. Chest pain  R07.9 DG Chest 2 View    DG Chest 2 View      ED Discharge Orders          Ordered    metroNIDAZOLE (FLAGYL) 500 MG tablet  2 times daily        09/07/20 0436             Discharge Instructions Discussed with and Provided to Patient:    Discharge Instructions      You were evaluated in the Emergency Department and after careful evaluation, we did not find any emergent condition requiring admission or further testing in the hospital.  Your exam/testing today was overall reassuring.  Please take the metronidazole medication prescribed to treat for this.  Your EKG and chest x-ray were normal.  Please return to the Emergency Department if you experience any worsening of your condition.  Thank you for allowing [place] to be a part of your care.        Sabas Sous, MD 09/07/20 [phone]

## 2020-09-07 NOTE — ED Triage Notes (Addendum)
Pt complains of intermittent chest pain that radiates to her back when she takes a deep breath since last night. Pt states that this has been going on for months. Pt reports being in no pain as of now. Pt reports wanting to get checked for STD's.

## 2020-09-07 NOTE — ED Notes (Signed)
[id number] performed phlebotomy, accidental.

## 2020-10-15 DIAGNOSIS — Z419 Encounter for procedure for purposes other than remedying health state, unspecified: Secondary | ICD-10-CM | POA: Diagnosis not present

## 2020-10-18 ENCOUNTER — Ambulatory Visit (INDEPENDENT_AMBULATORY_CARE_PROVIDER_SITE_OTHER): Payer: BC Managed Care – PPO | Admitting: *Deleted

## 2020-10-18 ENCOUNTER — Ambulatory Visit (INDEPENDENT_AMBULATORY_CARE_PROVIDER_SITE_OTHER): Payer: BC Managed Care – PPO

## 2020-10-18 ENCOUNTER — Other Ambulatory Visit: Payer: Self-pay

## 2020-10-18 VITALS — BP 112/76 | HR 88 | Wt 162.0 lb

## 2020-10-18 DIAGNOSIS — Z3201 Encounter for pregnancy test, result positive: Secondary | ICD-10-CM

## 2020-10-18 DIAGNOSIS — Z3A09 9 weeks gestation of pregnancy: Secondary | ICD-10-CM | POA: Diagnosis not present

## 2020-10-18 DIAGNOSIS — Z3401 Encounter for supervision of normal first pregnancy, first trimester: Secondary | ICD-10-CM

## 2020-10-18 DIAGNOSIS — Z3400 Encounter for supervision of normal first pregnancy, unspecified trimester: Secondary | ICD-10-CM | POA: Insufficient documentation

## 2020-10-18 MED ORDER — BLOOD PRESSURE KIT: 1.0000 | PACK | 0 refills | Status: DC

## 2020-10-18 NOTE — Progress Notes (Signed)
New OB Intake  I explained I am completing New OB Intake today. We discussed her EDD of 05/21/2021 that is based off today's scan of 9.2 weeks . Pt is G1/P0. I reviewed her allergies, medications, Medical/Surgical/OB history, and appropriate screenings. I informed her of [facility] services. Based on history, this is a/an  pregnancy uncomplicated .   There are no problems to display for this patient.   Concerns addressed today  Delivery Plans:  Plans to deliver at [facility].   MyChart/Babyscripts MyChart access verified. I explained pt will have some visits in office and some virtually. Babyscripts instructions given and order placed. Patient verifies receipt of registration text/e-mail. Account successfully created and app downloaded.  Blood Pressure Cuff  Blood pressure cuff ordered for patient to pick-up from [employer].    Anatomy US Explained first scheduled [place] will be around 19 weeks.   Labs Discussed Avelina Laine genetic screening with patient. Would like both Panorama and Horizon drawn at new OB visit. Routine prenatal labs needed.  Covid Vaccine Patient has not covid vaccine.   Informed patient of [facility] website  and placed link in her AVS.   Send link to Pregnancy Navigators   Placed OB Box on problem list and updated   Patient informed that the ultrasound is considered a limited obstetric ultrasound and is not intended to be a complete ultrasound exam.  Patient also informed that the ultrasound is not being completed with the intent of assessing for fetal or placental anomalies or any pelvic abnormalities. Explained that the purpose of today's ultrasound is to assess for dating and fetal heart rate.  Patient acknowledges the purpose of the exam and the limitations of the study.      First visit review I reviewed new OB appt with pt. I explained she will have a pelvic exam, ob bloodwork with genetic screening, and PAP smear. Explained pt will be seen by Dr Alvester Morin at  first visit. Explained that patient will be seen by pregnancy navigator following visit with provider.     Scheryl Marten, RN 10/18/2020  11:55 AM

## 2020-10-18 NOTE — Progress Notes (Signed)
Patient was assessed and managed by nursing staff during this encounter. I have reviewed the chart and agree with the documentation and plan.   Jaynie Collins, MD 10/18/2020 3:07 PM

## 2020-11-02 ENCOUNTER — Encounter: Payer: BC Managed Care – PPO | Admitting: Family Medicine

## 2020-11-03 ENCOUNTER — Other Ambulatory Visit (HOSPITAL_COMMUNITY)
Admission: RE | Admit: 2020-11-03 | Discharge: 2020-11-03 | Disposition: A | Discharge status: Home / Self Care | Payer: BC Managed Care – PPO | Source: Ambulatory Visit | Attending: Family Medicine | Admitting: Family Medicine

## 2020-11-03 ENCOUNTER — Encounter: Payer: Self-pay | Admitting: Family Medicine

## 2020-11-03 ENCOUNTER — Other Ambulatory Visit: Payer: Self-pay

## 2020-11-03 ENCOUNTER — Ambulatory Visit (INDEPENDENT_AMBULATORY_CARE_PROVIDER_SITE_OTHER): Payer: BC Managed Care – PPO | Admitting: Family Medicine

## 2020-11-03 VITALS — BP 124/84 | HR 98 | Wt 165.0 lb

## 2020-11-03 DIAGNOSIS — O23591 Infection of other part of genital tract in pregnancy, first trimester: Secondary | ICD-10-CM

## 2020-11-03 DIAGNOSIS — A5901 Trichomonal vulvovaginitis: Secondary | ICD-10-CM | POA: Insufficient documentation

## 2020-11-03 DIAGNOSIS — Z3401 Encounter for supervision of normal first pregnancy, first trimester: Secondary | ICD-10-CM

## 2020-11-03 DIAGNOSIS — Z3400 Encounter for supervision of normal first pregnancy, unspecified trimester: Secondary | ICD-10-CM

## 2020-11-03 MED ORDER — METRONIDAZOLE 500 MG PO TABS: 2000.0000 mg | ORAL_TABLET | Freq: Once | ORAL | 1 refills | Status: AC

## 2020-11-03 NOTE — Progress Notes (Signed)
INITIAL PRENATAL VISIT  Subjective:   Kelly Meadows is being seen today for her first obstetrical visit.  This is a planned pregnancy. This is a desired pregnancy.  She is at 26w4dgestation by LMP/US Her obstetrical history is significant for  STI in pregnancy- untreated- did not think metronidazole was safe and wanted to wait. Partner ERandall Hisswas not treated . Relationship with FOB: significant other, living together. Patient does intend to breast feed. Pregnancy history fully reviewed.  Patient reports nausea.  Indications for ASA therapy (per uptodate) One of the following: Previous pregnancy with preeclampsia, especially early onset and with an adverse outcome No Multifetal gestation No Chronic hypertension No Type 1 or 2 diabetes mellitus No Chronic kidney disease No Autoimmune disease (antiphospholipid syndrome, systemic lupus erythematosus) No  Two or more of the following: Nulliparity Yes Obesity (body mass index >30 kg/m2) No Family history of preeclampsia in mother or sister No Age ?35 years No Sociodemographic characteristics (African American race, low socioeconomic level) No Personal risk factors (eg, previous pregnancy with low birth weight or small for gestational age infant, previous adverse pregnancy outcome [eg, stillbirth], interval >10 years between pregnancies) No  Indications for early GDM screening  First-degree relative with diabetes No BMI >30kg/m2 No Age > 25 Yes Previous birth of an infant weighing ?4000 g No Gestational diabetes mellitus in a previous pregnancy No Glycated hemoglobin ?5.7 percent (39 mmol/mol), impaired glucose tolerance, or impaired fasting glucose on previous testing No High-risk race/ethnicity (eg, African American, Latino, Native American, Asian American, Pacific Islander) No Previous stillbirth of unknown cause No Maternal birthweight > 9 lbs No History of cardiovascular disease No Hypertension or on therapy for hypertension  No High-density lipoprotein cholesterol level <35 mg/dL (0.90 mmol/L) and/or a triglyceride level >250 mg/dL (2.82 mmol/L) No Polycystic ovary syndrome No Physical inactivity No Other clinical condition associated with insulin resistance (eg, severe obesity, acanthosis nigricans) No Current use of glucocorticoids No   Early screening tests: FBS, A1C, Random CBG, glucose challenge   Review of Systems:   Review of Systems  Objective:    Obstetric History OB History  Gravida Para Term Preterm AB Living  1            SAB IAB Ectopic Multiple Live Births               # Outcome Date GA Lbr Len/2nd Weight Sex Delivery Anes PTL Lv  1 Current             Past Medical History:  Diagnosis Date   Medical history non-contributory     Past Surgical History:  Procedure Laterality Date   NO PAST SURGERIES      Current Outpatient Medications on File Prior to Visit  Medication Sig Dispense Refill   Prenatal Vit-Fe Fumarate-FA (MULTIVITAMIN-PRENATAL) 27-0.8 MG TABS tablet Take 1 tablet by mouth daily at 12 noon.     Blood Pressure KIT 1 Device by Does not apply route once a week. To be monitored weekly from home 1 kit 0   No current facility-administered medications on file prior to visit.    No Known Allergies  Social History:  reports that she has never smoked. She has never used smokeless tobacco. She reports that she does not currently use alcohol after a past usage of about 1.0 standard drink per week. She reports that she does not currently use drugs after having used the following drugs: Marijuana.  Family History  Problem Relation Age of Onset  Breast cancer Mother     The following portions of the patient's history were reviewed and updated as appropriate: allergies, current medications, past family history, past medical history, past social history, past surgical history and problem list.  Review of Systems Review of Systems    Physical Exam:  BP 124/84   Pulse  98   Wt 165 lb (74.8 kg)   LMP 07/18/2020   BMI 26.63 kg/m  CONSTITUTIONAL: Well-developed, well-nourished female in no acute distress.  HENT:  Normocephalic, atraumatic, External right and left ear normal. Oropharynx is clear and moist EYES: Conjunctivae normal. No scleral icterus.  NECK: Normal range of motion, supple, no masses.  Normal thyroid.  SKIN: Skin is warm and dry. No rash noted. Not diaphoretic. No erythema. No pallor. MUSCULOSKELETAL: Normal range of motion. No tenderness.  No cyanosis, clubbing, or edema.   NEUROLOGIC: Alert and oriented to person, place, and time. Normal muscle tone coordination.  PSYCHIATRIC: Normal mood and affect. Normal behavior. Normal judgment and thought content. CARDIOVASCULAR: Normal heart rate noted, regular rhythm RESPIRATORY: Clear to auscultation bilaterally. Effort and breath sounds normal, no problems with respiration noted. BREASTS: Symmetric in size. No masses, skin changes, nipple drainage, or lymphadenopathy. ABDOMEN: Soft, normal bowel sounds, no distention noted.  No tenderness, rebound or guarding. Fundal ht: pelvic rim PELVIC: Normal appearing external genitalia; normal appearing vaginal mucosa and cervix.  No abnormal discharge noted.  Pap smear collected-- cervix was friable and strawberry in appearance with yellow discharge consistent with untreated trich.  Normal uterine size, no other palpable masses, no uterine or adnexal tenderness. FHR: 140   Assessment:    Pregnancy: G1P0000 1. Encounter for supervision of normal first pregnancy in first trimester - CBC/D/Plt+RPR+Rh+ABO+RubIgG... - Culture, OB Urine - Genetic Screening - Cytology - PAP - [place] MFM OB COMP + 14 WK; Future  2. Supervision of normal first pregnancy, antepartum Up dated  3. Trich - did not take medications - Partner [name]) in office today and discussed need for treatment and reviewed expedited partner treatment for trich. [name] accepts. Rx for 2000g dosing  sent for patient and partner.      Plan:     Initial labs drawn. Prenatal vitamins. Problem list reviewed and updated. Reviewed in detail the nature of the practice with collaborative care between  Genetic screening discussed: NIPS/AFP requested. Role of ultrasound in pregnancy discussed; Anatomy US: requested. Amniocentesis discussed: not indicated. Follow up in 4 weeks. Discussed clinic routines, schedule of care and testing, genetic screening options, involvement of students and residents under the direct supervision of APPs and doctors and presence of female providers. Pt verbalized understanding.   Caren Macadam, MD 11/03/2020 10:04 AM

## 2020-11-03 NOTE — Patient Instructions (Signed)
Expedited Partner Therapy:  °Information Sheet for Patients and Partners  °            ° °You have been offered expedited partner therapy (EPT). This information sheet contains important information and warnings you need to be aware of, so please read it carefully.  ° °Expedited Partner Therapy (EPT) is the clinical practice of treating the sexual partners of persons who receive chlamydia, gonorrhea, or trichomoniasis diagnoses by providing medications or prescriptions to the patient. Patients then provide partners with these therapies without the health-care provider having examined the partner. In other words, EPT is a convenient, fast and private way for patients to help their sexual partners get treated.  ° °Chlamydia and gonorrhea are bacterial infections you get from having sex with a person who is already infected. Trichomoniasis (or “trich”) is a very common sexually transmitted infection (STI) that is caused by infection with a protozoan parasite called Trichomonas vaginalis.  Many people with these infections don’t know it because they feel fine, but without treatment these infections can cause serious health problems, such as pelvic inflammatory disease, ectopic pregnancy, infertility and increased risk of HIV.  ° °It is important to get treated as soon as possible to protect your health, to avoid spreading these infections to others, and to prevent yourself from becoming re-infected. The good news is these infections can be easily cured with proper antibiotic medicine. The best way to take care of your self is to see a doctor or go to your local health department. If you are not able to see a doctor or other medical provider, you should take EPT.  ° ° °Recommended Medication: °EPT for Chlamydia:  Azithromycin (Zithromax) 1 gram orally in a single dose °EPT for Gonorrhea:  Cefixime (Suprax) 400 milligrams orally in a single dose PLUS azithromycin (Zithromax) 1 gram orally in a single dose °EPT for  Trichomoniasis:  Metronidazole (Flagyl) 2 grams orally in a single dose ° ° °These medicines are very safe. However, you should not take them if you have ever had an allergic reaction (like a rash) to any of these medicines: azithromycin (Zithromax), erythromycin, clarithromycin (Biaxin), metronidazole (Flagyl), tinidazole (Tindimax). If you are uncertain about whether you have an allergy, call your medical provider or pharmacist before taking this medicine. If you have a serious, long-term illness like kidney, liver or heart disease, colitis or stomach problems, or you are currently taking other prescription medication, talk to your provider before taking this medication.  ° °Women: If you have lower belly pain, pain during sex, vomiting, or a fever, do not take this medicine. Instead, you should see a medical provider to be certain you do not have pelvic inflammatory disease (PID). PID can be serious and lead to infertility, pregnancy problems or chronic pelvic pain.  ° °Pregnant Women: It is very important for you to see a doctor to get pregnancy services and pre-natal care. These antibiotics for EPT are safe for pregnant women, but you still need to see a medical provider as soon as possible. It is also important to note that Doxycycline is an alternative therapy for chlamydia, but it should not be taken by someone who is pregnant.  ° °Men: If you have pain or swelling in the testicles or a fever, do not take this medicine and see a medical provider.    ° °Men who have sex with men (MSM): MSM in [place] continue to experience high rates of syphilis and HIV. Many MSM with gonorrhea or   chlamydia could also have syphilis and/or HIV and not know it. If you are a man who has sex with other men, it is very important that you see a medical provider and are tested for HIV and syphilis. EPT is not recommended for gonorrhea for MSM.  Recommended treatment for gonorrhea for MSM is Rocephin (shot) AND azithromycin  due to decreased cure rate.  Please see your medical provider if this is the case.   ° °Along with this information sheet is a prescription for the medicine. If you receive a prescription it will be in your name and will indicate your date of birth, or it will be in the name of “Expedited Partner Therapy”.   In either case, you can have the prescription filled at a pharmacy. You will be responsible for the cost of the medicine, unless you have prescription drug coverage. In that case, you could provide your name so the pharmacy could bill your health plan.  ° °Take the medication as directed. Some people will have a mild, upset stomach, which does not last long. AVOID alcohol 24 hours after taking metronidazole (Flagyl) to reduce the possibility of a disulfiram-like reaction (severe vomiting and abdominal pain).  After taking the medicine, do not have sex for 7 days. Do not share this medicine or give it to anyone else. It is important to tell everyone you have had sex with in the last 60 days that they need to go and get tested for sexually transmitted infections.  ° °Ways to prevent these and other sexually transmitted infections (STIs):  ° °• Abstain from sex. This is the only sure way to avoid getting an STI.  °• Use barrier methods, such as condoms, consistently and correctly.  °• Limit the number of sexual partners.  °• Have regular physical exams, including testing for STIs.  ° °For more information about EPT or other issues pertaining to an STI, please contact your medical provider or the [place] [facility] at [phone] or [email or web address]   ° °

## 2020-11-04 ENCOUNTER — Encounter: Payer: Self-pay | Admitting: Radiology

## 2020-11-04 LAB — CBC/D/PLT+RPR+RH+ABO+RUBIGG...
Antibody Screen: NEGATIVE
Basophils Absolute: 0 10*3/uL (ref 0.0–0.2)
Basos: 0 %
EOS (ABSOLUTE): 0.1 10*3/uL (ref 0.0–0.4)
Eos: 0 %
HCV Ab: <0.1 s/co ratio (ref 0.0–0.9)
HIV Screen 4th Generation wRfx: NONREACTIVE
Hematocrit: 37.2 % (ref 34.0–46.6)
Hemoglobin: 12.1 g/dL (ref 11.1–15.9)
Hepatitis B Surface Ag: NEGATIVE
Immature Grans (Abs): 0 10*3/uL (ref 0.0–0.1)
Immature Granulocytes: 0 %
Lymphocytes Absolute: 2.3 10*3/uL (ref 0.7–3.1)
Lymphs: 20 %
MCH: 28 pg (ref 26.6–33.0)
MCHC: 32.5 g/dL (ref 31.5–35.7)
MCV: 86 fL (ref 79–97)
Monocytes Absolute: 0.8 10*3/uL (ref 0.1–0.9)
Monocytes: 7 %
Neutrophils Absolute: 8.2 10*3/uL — ABNORMAL HIGH (ref 1.4–7.0)
Neutrophils: 73 %
Platelets: 202 10*3/uL (ref 150–450)
RBC: 4.32 x10E6/uL (ref 3.77–5.28)
RDW: 12.7 % (ref 11.7–15.4)
RPR Ser Ql: NONREACTIVE
Rh Factor: POSITIVE
Rubella Antibodies, IGG: 6.56 index (ref >0.99)
WBC: 11.4 10*3/uL — ABNORMAL HIGH (ref 3.4–10.8)

## 2020-11-04 LAB — CYTOLOGY - PAP
Chlamydia: NEGATIVE
Comment: NEGATIVE
Comment: NEGATIVE
Comment: NORMAL
Diagnosis: NEGATIVE
Neisseria Gonorrhea: NEGATIVE
Trichomonas: POSITIVE — AB

## 2020-11-04 LAB — HCV INTERPRETATION

## 2020-11-05 LAB — URINE CULTURE, OB REFLEX: Organism ID, Bacteria: NO GROWTH

## 2020-11-05 LAB — CULTURE, OB URINE

## 2020-11-14 ENCOUNTER — Encounter: Payer: Self-pay | Admitting: Family Medicine

## 2020-11-15 DIAGNOSIS — Z419 Encounter for procedure for purposes other than remedying health state, unspecified: Secondary | ICD-10-CM | POA: Diagnosis not present

## 2020-11-18 ENCOUNTER — Telehealth: Payer: Self-pay

## 2020-11-18 NOTE — Telephone Encounter (Signed)
TC to pt regarding horizon results  Pt not ava lvm for pt to return call to the office.

## 2020-11-21 ENCOUNTER — Other Ambulatory Visit: Payer: Self-pay

## 2020-11-21 ENCOUNTER — Telehealth: Payer: Self-pay

## 2020-11-21 NOTE — Telephone Encounter (Signed)
Left a message on her vmail to return our call for the message she left with the after hours line

## 2020-11-21 NOTE — Telephone Encounter (Signed)
Pt called back today regarding vm left on Friday to make aware of horizon results.  Pt made aware and voiced understanding  Advised can do screening on partner pt also given [employer] contact number for a genetic info session.

## 2020-11-21 NOTE — Progress Notes (Signed)
Patient was made aware of Horizon Results today.  Pt given info to reach out to genetic counselor And made aware can have partner do screening.  Pt agreeable and voiced understanding.

## 2020-11-22 ENCOUNTER — Encounter: Payer: Self-pay | Admitting: Obstetrics & Gynecology

## 2020-11-22 DIAGNOSIS — D563 Thalassemia minor: Secondary | ICD-10-CM | POA: Insufficient documentation

## 2020-11-22 HISTORY — DX: Thalassemia minor: D56.3

## 2020-11-23 ENCOUNTER — Other Ambulatory Visit: Payer: Self-pay | Admitting: *Deleted

## 2020-11-23 MED ORDER — PREPLUS 27-1 MG PO TABS: 1.0000 | ORAL_TABLET | Freq: Every day | ORAL | 13 refills | Status: DC

## 2020-11-29 ENCOUNTER — Encounter: Payer: Self-pay | Admitting: Obstetrics & Gynecology

## 2020-11-29 ENCOUNTER — Other Ambulatory Visit: Payer: Self-pay

## 2020-11-29 ENCOUNTER — Ambulatory Visit (INDEPENDENT_AMBULATORY_CARE_PROVIDER_SITE_OTHER): Payer: BC Managed Care – PPO | Admitting: Obstetrics & Gynecology

## 2020-11-29 ENCOUNTER — Other Ambulatory Visit (HOSPITAL_COMMUNITY)
Admission: RE | Admit: 2020-11-29 | Discharge: 2020-11-29 | Disposition: A | Discharge status: Home / Self Care | Payer: BC Managed Care – PPO | Source: Ambulatory Visit | Attending: Obstetrics & Gynecology | Admitting: Obstetrics & Gynecology

## 2020-11-29 VITALS — BP 101/67 | HR 82 | Wt 167.6 lb

## 2020-11-29 DIAGNOSIS — O23591 Infection of other part of genital tract in pregnancy, first trimester: Secondary | ICD-10-CM | POA: Diagnosis present

## 2020-11-29 DIAGNOSIS — A5901 Trichomonal vulvovaginitis: Secondary | ICD-10-CM | POA: Diagnosis present

## 2020-11-29 DIAGNOSIS — Z3400 Encounter for supervision of normal first pregnancy, unspecified trimester: Secondary | ICD-10-CM

## 2020-11-29 DIAGNOSIS — D563 Thalassemia minor: Secondary | ICD-10-CM

## 2020-11-29 DIAGNOSIS — Z3A15 15 weeks gestation of pregnancy: Secondary | ICD-10-CM

## 2020-11-29 NOTE — Progress Notes (Signed)
   PRENATAL VISIT NOTE  Subjective:  Kelly Meadows is a 20 y.o. G1P0 at [redacted]w[redacted]d being seen today for ongoing prenatal care.  She is currently monitored for the following issues for this low-risk pregnancy and has Supervision of normal first pregnancy, antepartum; Trichomonal vaginitis during pregnancy in first trimester; and Alpha thalassemia silent carrier on their problem list.  Patient reports no complaints.  Contractions: Not present. Vag. Bleeding: None.   . Denies leaking of fluid.   The following portions of the patient's history were reviewed and updated as appropriate: allergies, current medications, past family history, past medical history, past social history, past surgical history and problem list.   Objective:   Vitals:   11/29/20 1505  BP: 101/67  Pulse: 82  Weight: 167 lb 9.6 oz (76 kg)    Fetal Status: Fetal Heart Rate (bpm): 156         General:  Alert, oriented and cooperative. Patient is in no acute distress.  Skin: Skin is warm and dry. No rash noted.   Cardiovascular: Normal heart rate noted  Respiratory: Normal respiratory effort, no problems with respiration noted  Abdomen: Soft, gravid, appropriate for gestational age.  Pain/Pressure: Absent     Pelvic: Cervical exam deferred        Extremities: Normal range of motion.     Mental Status: Normal mood and affect. Normal behavior. Normal judgment and thought content.   Assessment and Plan:  Pregnancy: G1P0 at [redacted]w[redacted]d 1. Trichomonal vaginitis during pregnancy in first trimester Test of cure done today. Denies having unprotected intercourse with FOB, who is here today with her.  He reports not taking his medication, expedited partner treatment prescribed for him electronically with his consent. - Cervicovaginal ancillary only  2. Alpha thalassemia silent carrier FOB received supplies and information for Horizon testing  3. [redacted] weeks gestation of pregnancy 4. Supervision of normal first pregnancy,  antepartum Low risk NIPS. Anatomy scan scheduled. - AFP, Serum, Open Spina Bifida No other complaints or concerns.  Routine obstetric precautions reviewed.  Please refer to After Visit Summary for other counseling recommendations.   Return in about 4 weeks (around 12/27/2020) for OFFICE OB VISIT (MD or APP).  Future Appointments  Date Time Provider Department Center  12/19/2020  1:30 PM [facility] US[facility]    Jaynie Collins, MD

## 2020-11-29 NOTE — Patient Instructions (Signed)

## 2020-11-30 LAB — AFP, SERUM, OPEN SPINA BIFIDA
AFP MoM: 0.93
AFP Value: 30.6 ng/mL
Gest. Age on Collection Date: 15.3 weeks
Maternal Age At EDD: 22.5 yr
OSBR Risk 1 IN: 10000
Test Results:: NEGATIVE
Weight: 165 [lb_av]

## 2020-12-01 LAB — CERVICOVAGINAL ANCILLARY ONLY
Chlamydia: NEGATIVE
Comment: NEGATIVE
Comment: NEGATIVE
Comment: NORMAL
Neisseria Gonorrhea: NEGATIVE
Trichomonas: POSITIVE — AB

## 2020-12-02 ENCOUNTER — Telehealth: Payer: Self-pay

## 2020-12-02 MED ORDER — METRONIDAZOLE 500 MG PO TABS: 500.0000 mg | ORAL_TABLET | Freq: Two times a day (BID) | ORAL | 0 refills | Status: AC

## 2020-12-02 NOTE — Telephone Encounter (Signed)
TC to pt regarding results and provider advice.  Pt not ava lvm for pt to contact the office .

## 2020-12-02 NOTE — Addendum Note (Signed)
Addended by: Jaynie Collins A on: 12/02/2020 08:20 AM   Modules accepted: Orders

## 2020-12-02 NOTE — Telephone Encounter (Signed)
-----   Message from Tereso Newcomer, MD sent at 12/02/2020  8:20 AM EST ----- Patient is still positive for trichomonal vaginitis (her partner was untreated during last encounter and expedited partner treatment was sent in).  Re-treatment with Metronidazole ordered, please advise to take as directed for entire days.   Patient and sex partner(s) should abstain from unprotected sexual activity for seven days after everyone receives appropriate treatment.  Patient will need repeat test of cure four weeks after treatment.  Please call to inform patient of results and recommendations, and advise to pick up prescription. Please advise patient to practice safe sex at all times.   Jaynie Collins, MD

## 2020-12-03 ENCOUNTER — Encounter: Payer: Self-pay | Admitting: Obstetrics & Gynecology

## 2020-12-15 DIAGNOSIS — Z419 Encounter for procedure for purposes other than remedying health state, unspecified: Secondary | ICD-10-CM | POA: Diagnosis not present

## 2020-12-19 ENCOUNTER — Ambulatory Visit: Payer: BC Managed Care – PPO

## 2020-12-20 ENCOUNTER — Ambulatory Visit: Payer: BC Managed Care – PPO | Attending: Family Medicine

## 2020-12-20 ENCOUNTER — Other Ambulatory Visit: Payer: Self-pay

## 2020-12-20 ENCOUNTER — Other Ambulatory Visit: Payer: Self-pay | Admitting: Family Medicine

## 2020-12-20 DIAGNOSIS — N83201 Unspecified ovarian cyst, right side: Secondary | ICD-10-CM | POA: Insufficient documentation

## 2020-12-20 DIAGNOSIS — O3482 Maternal care for other abnormalities of pelvic organs, second trimester: Secondary | ICD-10-CM | POA: Insufficient documentation

## 2020-12-20 DIAGNOSIS — Z3401 Encounter for supervision of normal first pregnancy, first trimester: Secondary | ICD-10-CM

## 2020-12-20 DIAGNOSIS — Z3A18 18 weeks gestation of pregnancy: Secondary | ICD-10-CM | POA: Insufficient documentation

## 2020-12-20 DIAGNOSIS — Z148 Genetic carrier of other disease: Secondary | ICD-10-CM | POA: Diagnosis present

## 2020-12-20 DIAGNOSIS — Z363 Encounter for antenatal screening for malformations: Secondary | ICD-10-CM | POA: Insufficient documentation

## 2020-12-21 ENCOUNTER — Other Ambulatory Visit: Payer: Self-pay | Admitting: *Deleted

## 2020-12-21 DIAGNOSIS — Z362 Encounter for other antenatal screening follow-up: Secondary | ICD-10-CM

## 2020-12-21 DIAGNOSIS — N83209 Unspecified ovarian cyst, unspecified side: Secondary | ICD-10-CM

## 2020-12-21 DIAGNOSIS — O3480 Maternal care for other abnormalities of pelvic organs, unspecified trimester: Secondary | ICD-10-CM

## 2020-12-28 ENCOUNTER — Other Ambulatory Visit: Payer: Self-pay

## 2020-12-28 ENCOUNTER — Other Ambulatory Visit (HOSPITAL_COMMUNITY)
Admission: RE | Admit: 2020-12-28 | Discharge: 2020-12-28 | Disposition: A | Discharge status: Home / Self Care | Payer: BC Managed Care – PPO | Source: Ambulatory Visit | Attending: Family Medicine | Admitting: Family Medicine

## 2020-12-28 ENCOUNTER — Encounter: Payer: Self-pay | Admitting: Family Medicine

## 2020-12-28 ENCOUNTER — Ambulatory Visit (INDEPENDENT_AMBULATORY_CARE_PROVIDER_SITE_OTHER): Payer: BC Managed Care – PPO | Admitting: Family Medicine

## 2020-12-28 VITALS — BP 112/66 | HR 84 | Wt 172.0 lb

## 2020-12-28 DIAGNOSIS — A5901 Trichomonal vulvovaginitis: Secondary | ICD-10-CM | POA: Diagnosis present

## 2020-12-28 DIAGNOSIS — O23591 Infection of other part of genital tract in pregnancy, first trimester: Secondary | ICD-10-CM | POA: Diagnosis not present

## 2020-12-28 DIAGNOSIS — Z3400 Encounter for supervision of normal first pregnancy, unspecified trimester: Secondary | ICD-10-CM

## 2020-12-28 NOTE — Patient Instructions (Signed)

## 2020-12-28 NOTE — Progress Notes (Signed)
ROB [redacted]w[redacted]d  AFP WNL on 11/29/20  CC: leaking in both breast and lower pelvic pain pt notes working and standing on feet a lot.  Pt needs TOC + TRICH on 11/29/20 pt requested to do self swab.

## 2020-12-28 NOTE — Progress Notes (Signed)
° °  PRENATAL VISIT NOTE  Subjective:  Kelly Meadows is a 20 y.o. G1P0 at [redacted]w[redacted]d being seen today for ongoing prenatal care.  She is currently monitored for the following issues for this low-risk pregnancy and has Supervision of normal first pregnancy, antepartum; Trichomonal vaginitis during pregnancy in first trimester; and Alpha thalassemia silent carrier on their problem list.  Patient reports  breast leaking and pelvic pain with standing all day .  Contractions: Not present. Vag. Bleeding: None.   . Denies leaking of fluid.   The following portions of the patient's history were reviewed and updated as appropriate: allergies, current medications, past family history, past medical history, past social history, past surgical history and problem list.   Objective:   Vitals:   12/28/20 1440  BP: 112/66  Pulse: 84  Weight: 172 lb (78 kg)    Fetal Status: Fetal Heart Rate (bpm): 164         General:  Alert, oriented and cooperative. Patient is in no acute distress.  Skin: Skin is warm and dry. No rash noted.   Cardiovascular: Normal heart rate noted  Respiratory: Normal respiratory effort, no problems with respiration noted  Abdomen: Soft, gravid, appropriate for gestational age.  Pain/Pressure: Present     Pelvic: Cervical exam deferred        Extremities: Normal range of motion.  Edema: None  Mental Status: Normal mood and affect. Normal behavior. Normal judgment and thought content.   Assessment and Plan:  Pregnancy: G1P0 at [redacted]w[redacted]d 1. Supervision of normal first pregnancy, antepartum Continue routine prenatal care. Has anatomy u/s scheduled  2. Trichomonal vaginitis during pregnancy in first trimester Check TOC--self swab done - Cervicovaginal ancillary only( [place])  General obstetric precautions including but not limited to vaginal bleeding, contractions, leaking of fluid and fetal movement were reviewed in detail with the patient. Please refer to After Visit Summary  for other counseling recommendations.   Return in 4 weeks (on 01/25/2021).  Future Appointments  Date Time Provider Department Center  01/18/2021  2:30 PM [facility] NURSE [facility]  01/18/2021  2:45 PM [facility] US[facility]  01/25/2021  4:10 PM Reva Bores, MD [facility]  03/01/2021  8:35 AM [place] Bing, MD [facility]    Reva Bores, MD

## 2020-12-30 ENCOUNTER — Other Ambulatory Visit: Payer: Self-pay

## 2020-12-30 ENCOUNTER — Emergency Department (HOSPITAL_COMMUNITY)
Admission: EM | Admit: 2020-12-30 | Discharge: 2020-12-31 | Disposition: A | Discharge status: Home / Self Care | Payer: BC Managed Care – PPO | Attending: Emergency Medicine | Admitting: Emergency Medicine

## 2020-12-30 ENCOUNTER — Encounter (HOSPITAL_COMMUNITY): Payer: Self-pay | Admitting: Emergency Medicine

## 2020-12-30 DIAGNOSIS — K649 Unspecified hemorrhoids: Secondary | ICD-10-CM | POA: Diagnosis present

## 2020-12-30 DIAGNOSIS — Z5321 Procedure and treatment not carried out due to patient leaving prior to being seen by health care provider: Secondary | ICD-10-CM | POA: Insufficient documentation

## 2020-12-30 LAB — CERVICOVAGINAL ANCILLARY ONLY
Chlamydia: NEGATIVE
Comment: NEGATIVE
Comment: NEGATIVE
Comment: NORMAL
Neisseria Gonorrhea: NEGATIVE
Trichomonas: NEGATIVE

## 2020-12-30 NOTE — ED Triage Notes (Signed)
Patient arrives stating she has hemorrhoids. Patient states she was using OTC treatments with relief, but when she stopped, they came back. Patient states no pain, just that they look worse.

## 2021-01-15 ENCOUNTER — Emergency Department (HOSPITAL_COMMUNITY): Payer: BC Managed Care – PPO

## 2021-01-15 ENCOUNTER — Emergency Department (HOSPITAL_COMMUNITY)
Admission: EM | Admit: 2021-01-15 | Discharge: 2021-01-15 | Disposition: A | Discharge status: Home / Self Care | Payer: BC Managed Care – PPO | Attending: Emergency Medicine | Admitting: Emergency Medicine

## 2021-01-15 ENCOUNTER — Encounter: Payer: Self-pay | Admitting: Obstetrics & Gynecology

## 2021-01-15 ENCOUNTER — Other Ambulatory Visit: Payer: Self-pay

## 2021-01-15 ENCOUNTER — Encounter (HOSPITAL_COMMUNITY): Payer: Self-pay

## 2021-01-15 DIAGNOSIS — R1011 Right upper quadrant pain: Secondary | ICD-10-CM | POA: Insufficient documentation

## 2021-01-15 DIAGNOSIS — R0789 Other chest pain: Secondary | ICD-10-CM | POA: Insufficient documentation

## 2021-01-15 DIAGNOSIS — O26892 Other specified pregnancy related conditions, second trimester: Secondary | ICD-10-CM | POA: Insufficient documentation

## 2021-01-15 DIAGNOSIS — Z419 Encounter for procedure for purposes other than remedying health state, unspecified: Secondary | ICD-10-CM | POA: Diagnosis not present

## 2021-01-15 DIAGNOSIS — Z3A21 21 weeks gestation of pregnancy: Secondary | ICD-10-CM | POA: Diagnosis not present

## 2021-01-15 LAB — CBC WITH DIFFERENTIAL/PLATELET
Abs Immature Granulocytes: 0.04 10*3/uL (ref 0.00–0.07)
Basophils Absolute: 0 10*3/uL (ref 0.0–0.1)
Basophils Relative: 0 %
Eosinophils Absolute: 0.1 10*3/uL (ref 0.0–0.5)
Eosinophils Relative: 1 %
HCT: 37.8 % (ref 36.0–46.0)
Hemoglobin: 11.3 g/dL — ABNORMAL LOW (ref 12.0–15.0)
Immature Granulocytes: 0 %
Lymphocytes Relative: 18 %
Lymphs Abs: 2 10*3/uL (ref 0.7–4.0)
MCH: 28.3 pg (ref 26.0–34.0)
MCHC: 29.9 g/dL — ABNORMAL LOW (ref 30.0–36.0)
MCV: 94.7 fL (ref 80.0–100.0)
Monocytes Absolute: 0.8 10*3/uL (ref 0.1–1.0)
Monocytes Relative: 7 %
Neutro Abs: 8.1 10*3/uL — ABNORMAL HIGH (ref 1.7–7.7)
Neutrophils Relative %: 74 %
Platelets: 156 10*3/uL (ref 150–400)
RBC: 3.99 MIL/uL (ref 3.87–5.11)
RDW: 13.6 % (ref 11.5–15.5)
WBC: 11 10*3/uL — ABNORMAL HIGH (ref 4.0–10.5)
nRBC: 0 % (ref 0.0–0.2)

## 2021-01-15 LAB — COMPREHENSIVE METABOLIC PANEL
ALT: 30 U/L (ref 0–44)
AST: 18 U/L (ref 15–41)
Albumin: 3.7 g/dL (ref 3.5–5.0)
Alkaline Phosphatase: 75 U/L (ref 38–126)
Anion gap: 7 (ref 5–15)
BUN: 10 mg/dL (ref 6–20)
CO2: 20 mmol/L — ABNORMAL LOW (ref 22–32)
Calcium: 9.1 mg/dL (ref 8.9–10.3)
Chloride: 109 mmol/L (ref 98–111)
Creatinine, Ser: 0.4 mg/dL — ABNORMAL LOW (ref 0.44–1.00)
GFR, Estimated: >60 mL/min (ref >60)
Glucose, Bld: 71 mg/dL (ref 70–99)
Potassium: 3.7 mmol/L (ref 3.5–5.1)
Sodium: 136 mmol/L (ref 135–145)
Total Bilirubin: 0.4 mg/dL (ref 0.3–1.2)
Total Protein: 7.1 g/dL (ref 6.5–8.1)

## 2021-01-15 LAB — URINALYSIS, ROUTINE W REFLEX MICROSCOPIC
Bilirubin Urine: NEGATIVE
Glucose, UA: NEGATIVE mg/dL
Hgb urine dipstick: NEGATIVE
Ketones, ur: NEGATIVE mg/dL
Leukocytes,Ua: NEGATIVE
Nitrite: NEGATIVE
Protein, ur: NEGATIVE mg/dL
Specific Gravity, Urine: 1.023 (ref 1.005–1.030)
pH: 6 (ref 5.0–8.0)

## 2021-01-15 LAB — LIPASE, BLOOD: Lipase: 33 U/L (ref 11–51)

## 2021-01-15 NOTE — L&D Delivery Note (Addendum)
OB/GYN Faculty Practice Delivery Note ? ?Barbar A Esmeralda is a 21 y.o. G1P1001 s/p SVD at [redacted]w[redacted]d. She was admitted for SOL.  ? ?ROM: 0h 36m with moderate meconium stained fluid ?GBS Status: Positive; received PCN x1 ? ?Delivery Date/Time: 05/18/21 at 0234 ? ?Delivery: Called to room and patient was complete and pushing. Head delivered LOA with compound hand presentation. Nuchal cord present x1 which was reduced at the perineum. Posterior arm/hand delivered, and shoulders and body then delivered in usual fashion. Infant with spontaneous cry, placed on mother's abdomen, dried and stimulated. Cord clamped x 2 after 1-minute delay and cut by FOB under direct supervision. Cord blood drawn. Placenta delivered spontaneously with gentle cord traction. Fundus firm with massage and Pitocin. TXA given. Labia, perineum, vagina, and cervix were inspected, and patient was found to have a 2nd degree perineal laceration that was repaired with 3-0 Vicryl and found to be hemostatic.  ? ?Placenta: Intact, 3VC - sent to L&D ?Complications: None  ?Lacerations: 2nd degree perineal   ?EBL: 275 mL ?Analgesia: Epidural ? ?Infant: Viable female  APGARs 8 and 9   3180g  ? ?Alen Bleacher, MD ?[facility] [place] [place]  ? ?GME ATTESTATION:  ?I saw and evaluated the patient. I was gowned, gloved, and present for the entire delivery and management of the patient. I personally performed the laceration repair. I agree with the findings and the plan of care as documented in the resident?s note. I have made changes to documentation as necessary. ? ?Vilma Meckel, MD ?OB Fellow, Faculty Practice ?[facility] ?05/18/2021 4:10 AM ? ?

## 2021-01-15 NOTE — ED Provider Notes (Signed)
[facility] DEPT Provider Note   CSN: [id number] Arrival date & time: 01/15/21  1249     History  Chief Complaint  Patient presents with   Abdominal Pain    Kelly Meadows is a 21 y.o. female.  HPI She is currently [redacted] weeks pregnant, and complains of pain below her right breast, present for 1 week, worse at nighttime.  She denies trauma.  She denies postprandial pain or vomiting.  Her pregnancy has been uncomplicated so far.  There has been no cough, ongoing shortness of breath, fever, chills or change in bowel and urinary habits.  She denies breast tenderness, or drainage currently.  There are no other known active modifying factors.    Home Medications Prior to Admission medications   Medication Sig Start Date End Date Taking? Authorizing Provider  Blood Pressure KIT 1 Device by Does not apply route once a week. To be monitored weekly from home 10/18/20   Anyanwu, Sallyanne Havers, MD  Prenatal Vit-Fe Fumarate-FA (MULTIVITAMIN-PRENATAL) 27-0.8 MG TABS tablet Take 1 tablet by mouth daily at 12 noon.    [provider]  Prenatal Vit-Fe Fumarate-FA (PREPLUS) 27-1 MG TABS Take 1 tablet by mouth daily. 11/23/20   Donnamae Jude, MD      Allergies    Patient has no known allergies.    Review of Systems   Review of Systems  All other systems reviewed and are negative.  Physical Exam Updated Vital Signs BP 120/80 (BP Location: Right Arm)    Pulse 90    Temp 98.6 F (37 C) (Oral)    Resp 16    LMP 07/18/2020    SpO2 98%  Physical Exam Vitals and nursing note reviewed.  Constitutional:      General: She is not in acute distress.    Appearance: She is well-developed.  HENT:     Head: Normocephalic and atraumatic.     Right Ear: External ear normal.     Left Ear: External ear normal.     Nose: No congestion.  Eyes:     Conjunctiva/sclera: Conjunctivae normal.     Pupils: Pupils are equal, round, and reactive to light.  Neck:     Trachea:  Phonation normal.  Cardiovascular:     Rate and Rhythm: Normal rate and regular rhythm.     Heart sounds: Normal heart sounds.  Pulmonary:     Effort: Pulmonary effort is normal.     Breath sounds: Normal breath sounds.  Chest:     Chest wall: Tenderness (Mild right anterior lower chest wall tenderness without crepitation or deformity.) present.  Abdominal:     General: There is no distension.     Palpations: Abdomen is soft.     Tenderness: There is no abdominal tenderness. There is no guarding.     Comments: Gravid with uterus above umbilicus, it is nontender to palpation.  Musculoskeletal:        General: Normal range of motion.     Cervical back: Normal range of motion and neck supple.  Skin:    General: Skin is warm and dry.  Neurological:     Mental Status: She is alert and oriented to person, place, and time.     Cranial Nerves: No cranial nerve deficit.     Sensory: No sensory deficit.     Motor: No abnormal muscle tone.     Coordination: Coordination normal.  Psychiatric:        Mood and Affect: Mood  normal.        Behavior: Behavior normal.        Thought Content: Thought content normal.        Judgment: Judgment normal.    ED Results / Procedures / Treatments   Labs (all labs ordered are listed, but only abnormal results are displayed) Labs Reviewed  CBC WITH DIFFERENTIAL/PLATELET - Abnormal; Notable for the following components:      Result Value   WBC 11.0 (*)    Hemoglobin 11.3 (*)    MCHC 29.9 (*)    Neutro Abs 8.1 (*)    All other components within normal limits  COMPREHENSIVE METABOLIC PANEL - Abnormal; Notable for the following components:   CO2 20 (*)    Creatinine, Ser 0.40 (*)    All other components within normal limits  URINALYSIS, ROUTINE W REFLEX MICROSCOPIC  LIPASE, BLOOD    EKG None  Radiology US Abdomen Limited RUQ (LIVER/GB)  Result Date: 01/15/2021 CLINICAL DATA:  Right upper quadrant abdomen pain EXAM: ULTRASOUND ABDOMEN LIMITED  RIGHT UPPER QUADRANT COMPARISON:  None. FINDINGS: Gallbladder: No gallstones or wall thickening visualized. No sonographic Murphy sign noted by sonographer. Common bile duct: Diameter: 2.4 mm Liver: No focal lesion identified. Within normal limits in parenchymal echogenicity. Portal vein is patent on color Doppler imaging with normal direction of blood flow towards the liver. Other: None. IMPRESSION: Normal right upper quadrant ultrasound. No sonographic evidence of acute cholecystitis. Electronically Signed   By: Abelardo Diesel M.D.   On: 01/15/2021 14:22    Procedures Procedures    Medications Ordered in ED Medications - No data to display  ED Course/ Medical Decision Making/ A&P                           Medical Decision Making   Patient Vitals for the past 24 hrs:  BP Temp Temp src Pulse Resp SpO2  01/15/21 1939 120/80 98.6 F (37 C) Oral 90 16 98 %  01/15/21 1848 113/77 -- -- 88 17 97 %  01/15/21 1739 109/72 -- -- 81 17 99 %  01/15/21 1620 99/62 -- -- 93 16 99 %  01/15/21 1254 112/65 98.5 F (36.9 C) Oral 86 16 100 %    7:43 PM Reevaluation with update and discussion attempted. After initial assessment and treatment, an updated evaluation reveals patient has left the department without telling anyone. Daleen Bo    Medical Decision Making: Summary: She is [redacted] weeks pregnant and is presenting with atraumatic right lower chest wall discomfort. There has been no trauma.  Pregnancy is uncomplicated.  No typical gallbladder symptoms including vomiting or postprandial pain.  Critical Interventions-chest wall pain; to evaluate  Chief Complaint  Patient presents with   Abdominal Pain    and assess for illness characterized as Acute and Self-Limited   After These Interventions, the Patient was reevaluated and was found with reassuring physical, laboratory, and ultrasound imaging findings.  Suspect musculoskeletal chest wall pain.  No evidence for pregnancy complication at this time.   No indication for hospitalization or further ED intervention.  This patient is Presenting for Evaluation of chest wall pain, which does require a range of treatment options, and is a complaint that involves a moderate risk of morbidity and mortality. The Differential Diagnoses include fracture, muscle strain, gallbladder disease, pregnancy complication. I decided to review pertinent External Data, and in summary last prenatal visit on 12/28/2020, no ongoing issues at that time.  Follow-up STD swab done, at that time, is negative. I did not require additional Historical Information from anyone, as the patient is a good historian.  Clinical Laboratory Tests Ordered, included CBC, Metabolic panel, and Urinalysis. Review indicates essentially normal. Emergent testing abnormality management required for stabilization-no Radiologic Tests Ordered, included ultrasound right upper quadrant abdomen limited.  I independently Visualized: Radiograph images, which show no acute abnormalities.    CRITICAL CARE-no Performed by: Daleen Bo  Nursing Notes Reviewed/ Care Coordinated Applicable Imaging Reviewed Interpretation of Laboratory Data incorporated into ED treatment  The patient appears reasonably screened and/or stabilized for discharge and I doubt any other medical condition or other [facility] requiring further screening, evaluation, or treatment in the ED at this time prior to discharge.  Plan: Home Medications-Tylenol every 4 hours as needed pain, continue current; Home Treatments-heat to affected area; return here if the recommended treatment, does not improve the symptoms; Recommended follow up-OBS schedule            Final Clinical Impression(s) / ED Diagnoses Final diagnoses:  RUQ abdominal pain  Chest wall pain  [redacted] weeks gestation of pregnancy    Rx / DC Orders ED Discharge Orders     None         Daleen Bo, MD 01/15/21 1944

## 2021-01-15 NOTE — ED Triage Notes (Addendum)
Pt c/o intermittent abdominal pain x1 week. Pt states laying down last night made the pain worse. Pt states last bowel movement was yesterday.

## 2021-01-15 NOTE — Discharge Instructions (Signed)
The testing did not show any serious problems.  For the pain you are having, take Tylenol 650 mg every 4 hours.  Also try using heating pad on the sore area 3-4 times a day.

## 2021-01-15 NOTE — ED Notes (Signed)
FHR 158/bpm.

## 2021-01-15 NOTE — ED Provider Notes (Signed)
Emergency Medicine Provider Triage Evaluation Note  Kelly Meadows , a 21 y.o. female  was evaluated in triage.  Pt complains of intermittent right upper quadrant pain that is worse after she eats for the last week.  Patient is [redacted] weeks pregnant with a female fetus.  Does follow with routine OB visits.  Denies any change in fetal movement, denies any vaginal bleeding or discharge..  Review of Systems  Positive: Nausea, right upper quadrant abdominal pain Negative: Decreased fetal movement, vaginal bleeding or discharge  Physical Exam  BP 112/65 (BP Location: Left Arm)    Pulse 86    Temp 98.5 F (36.9 C) (Oral)    Resp 16    LMP 07/18/2020    SpO2 100%  Gen:   Awake, no distress   Resp:  Normal effort  MSK:   Moves extremities without difficulty  Other:  Gravid abdomen which is nontender.  No Murphy sign.  Medical Decision Making  Medically screening exam initiated at 1:34 PM.  Appropriate orders placed.  Kelly Meadows was informed that the remainder of the evaluation will be completed by another provider, this initial triage assessment does not replace that evaluation, and the importance of remaining in the ED until their evaluation is complete.  Fetal heart tones 158 in triage. Right upper quadrant ultrasound and labs ordered.  This chart was dictated using voice recognition software, Dragon. Despite the best efforts of this provider to proofread and correct errors, errors may still occur which can change documentation meaning.    Paris Lore, PA-C 01/15/21 1347    Mancel Bale, MD 01/15/21 1539

## 2021-01-16 ENCOUNTER — Telehealth: Payer: Self-pay

## 2021-01-16 NOTE — Telephone Encounter (Signed)
Transition Care Management Unsuccessful Follow-up Telephone Call  Date of discharge and from where:  01/15/2021-[facility]  Attempts:  1st Attempt  Reason for unsuccessful TCM follow-up call:  Left voice message

## 2021-01-17 NOTE — Telephone Encounter (Signed)
Transition Care Management Follow-up Telephone Call Date of discharge and from where: 01/16/2020 from [facility] How have you been since you were released from the hospital? Pt stated that she is feeling better and the chest pains are not as bad. Pt denies any cp, sob, doe, dizziness or changes in vision.  Any questions or concerns? No  Items Reviewed: Did the pt receive and understand the discharge instructions provided? Yes  Medications obtained and verified? Yes  Other? No  Any new allergies since your discharge? No  Dietary orders reviewed? No Do you have support at home? Yes   Functional Questionnaire: (I = Independent and D = Dependent) ADLs: I  Bathing/Dressing- I  Meal Prep- I  Eating- I  Maintaining continence- I  Transferring/Ambulation- I  Managing Meds- I   Follow up appointments reviewed:  PCP Hospital f/u appt confirmed? No   Specialist Hospital f/u appt confirmed? Yes  Scheduled to see Tinnie Gens, MD on 01/25/2021 @ 4:10pm. Are transportation arrangements needed? No  If their condition worsens, is the pt aware to call PCP or go to the Emergency Dept.? Yes Was the patient provided with contact information for the PCP's office or ED? Yes Was to pt encouraged to call back with questions or concerns? Yes

## 2021-01-18 ENCOUNTER — Ambulatory Visit: Payer: BC Managed Care – PPO | Admitting: *Deleted

## 2021-01-18 ENCOUNTER — Other Ambulatory Visit: Payer: Self-pay

## 2021-01-18 ENCOUNTER — Ambulatory Visit: Payer: BC Managed Care – PPO | Attending: Obstetrics and Gynecology

## 2021-01-18 VITALS — BP 99/60 | HR 84

## 2021-01-18 DIAGNOSIS — D563 Thalassemia minor: Secondary | ICD-10-CM

## 2021-01-18 DIAGNOSIS — Z3400 Encounter for supervision of normal first pregnancy, unspecified trimester: Secondary | ICD-10-CM | POA: Diagnosis present

## 2021-01-18 DIAGNOSIS — Z3A22 22 weeks gestation of pregnancy: Secondary | ICD-10-CM | POA: Diagnosis not present

## 2021-01-18 DIAGNOSIS — N83209 Unspecified ovarian cyst, unspecified side: Secondary | ICD-10-CM | POA: Diagnosis present

## 2021-01-18 DIAGNOSIS — O3482 Maternal care for other abnormalities of pelvic organs, second trimester: Secondary | ICD-10-CM

## 2021-01-18 DIAGNOSIS — Z362 Encounter for other antenatal screening follow-up: Secondary | ICD-10-CM

## 2021-01-18 DIAGNOSIS — N83201 Unspecified ovarian cyst, right side: Secondary | ICD-10-CM | POA: Diagnosis not present

## 2021-01-18 DIAGNOSIS — O3480 Maternal care for other abnormalities of pelvic organs, unspecified trimester: Secondary | ICD-10-CM

## 2021-01-18 DIAGNOSIS — Z148 Genetic carrier of other disease: Secondary | ICD-10-CM | POA: Insufficient documentation

## 2021-01-25 ENCOUNTER — Other Ambulatory Visit: Payer: Self-pay

## 2021-01-25 ENCOUNTER — Ambulatory Visit (INDEPENDENT_AMBULATORY_CARE_PROVIDER_SITE_OTHER): Payer: BC Managed Care – PPO | Admitting: Family Medicine

## 2021-01-25 VITALS — BP 109/70 | HR 111 | Wt 180.0 lb

## 2021-01-25 DIAGNOSIS — Z3400 Encounter for supervision of normal first pregnancy, unspecified trimester: Secondary | ICD-10-CM

## 2021-01-25 NOTE — Progress Notes (Signed)
° °  PRENATAL VISIT NOTE  Subjective:  Kelly Meadows is a 21 y.o. G1P0 at [redacted]w[redacted]d being seen today for ongoing prenatal care.  She is currently monitored for the following issues for this low-risk pregnancy and has Supervision of normal first pregnancy, antepartum; Trichomonal vaginitis during pregnancy in first trimester; and Alpha thalassemia silent carrier on their problem list.  Patient reports  RUQ pain with negative u/s Does not know triggers or how long pain lasts.  Contractions: Not present. Vag. Bleeding: None.  Movement: Present. Denies leaking of fluid.   The following portions of the patient's history were reviewed and updated as appropriate: allergies, current medications, past family history, past medical history, past social history, past surgical history and problem list.   Objective:   Vitals:   01/25/21 1620  BP: 109/70  Pulse: (!) 111  Weight: 180 lb (81.6 kg)    Fetal Status: Fetal Heart Rate (bpm): 155 Fundal Height: 23 cm Movement: Present     General:  Alert, oriented and cooperative. Patient is in no acute distress.  Skin: Skin is warm and dry. No rash noted.   Cardiovascular: Normal heart rate noted  Respiratory: Normal respiratory effort, no problems with respiration noted  Abdomen: Soft, gravid, appropriate for gestational age.  Pain/Pressure: Absent     Pelvic: Cervical exam deferred        Extremities: Normal range of motion.  Edema: None  Mental Status: Normal mood and affect. Normal behavior. Normal judgment and thought content.   Assessment and Plan:  Pregnancy: G1P0 at [redacted]w[redacted]d 1. Supervision of normal first pregnancy, antepartum Continue routine prenatal care.  2. RUQ pain Symptom diary  General obstetric precautions including but not limited to vaginal bleeding, contractions, leaking of fluid and fetal movement were reviewed in detail with the patient. Please refer to After Visit Summary for other counseling recommendations.   Return in 4 weeks  (on 02/22/2021) for [facility], 28 wk labs.  Future Appointments  Date Time Provider Department Center  03/01/2021  8:35 AM Santiago Bing, MD [facility]    Reva Bores, MD

## 2021-01-25 NOTE — Progress Notes (Signed)
Having some right upper abdominal pain x 2 weeks

## 2021-02-08 ENCOUNTER — Other Ambulatory Visit (HOSPITAL_COMMUNITY)
Admission: RE | Admit: 2021-02-08 | Discharge: 2021-02-08 | Disposition: A | Discharge status: Home / Self Care | Payer: BC Managed Care – PPO | Source: Ambulatory Visit | Attending: Family Medicine | Admitting: Family Medicine

## 2021-02-08 ENCOUNTER — Ambulatory Visit: Payer: BC Managed Care – PPO

## 2021-02-08 ENCOUNTER — Other Ambulatory Visit: Payer: Self-pay

## 2021-02-08 VITALS — BP 108/71 | HR 108 | Wt 181.0 lb

## 2021-02-08 DIAGNOSIS — A599 Trichomoniasis, unspecified: Secondary | ICD-10-CM

## 2021-02-08 DIAGNOSIS — Z113 Encounter for screening for infections with a predominantly sexual mode of transmission: Secondary | ICD-10-CM | POA: Insufficient documentation

## 2021-02-08 DIAGNOSIS — Z3A25 25 weeks gestation of pregnancy: Secondary | ICD-10-CM

## 2021-02-08 NOTE — Progress Notes (Signed)
Pt presents for STI screening. Pt denies exposure to STIs.  Self swab was sent to the lab. Kelly Meadows l Wynn Alldredge, CMA

## 2021-02-08 NOTE — Progress Notes (Signed)
Patient seen and assessed by nursing staff.  Agree with documentation and plan.  

## 2021-02-09 LAB — CERVICOVAGINAL ANCILLARY ONLY
Chlamydia: NEGATIVE
Comment: NEGATIVE
Comment: NEGATIVE
Comment: NORMAL
Neisseria Gonorrhea: NEGATIVE
Trichomonas: POSITIVE — AB

## 2021-02-09 MED ORDER — METRONIDAZOLE 500 MG PO TABS: 500.0000 mg | ORAL_TABLET | Freq: Two times a day (BID) | ORAL | 0 refills | Status: DC

## 2021-02-10 MED ORDER — METRONIDAZOLE 500 MG PO TABS: 500.0000 mg | ORAL_TABLET | Freq: Two times a day (BID) | ORAL | 0 refills | Status: DC

## 2021-02-10 NOTE — Addendum Note (Signed)
Addended by: Donnamae Jude on: 02/10/2021 08:21 AM   Modules accepted: Orders

## 2021-02-13 ENCOUNTER — Telehealth: Payer: Self-pay

## 2021-02-13 NOTE — Telephone Encounter (Signed)
Called the pt and told her that she can be seen at the MAU

## 2021-02-15 DIAGNOSIS — Z419 Encounter for procedure for purposes other than remedying health state, unspecified: Secondary | ICD-10-CM | POA: Diagnosis not present

## 2021-02-20 ENCOUNTER — Telehealth: Payer: Self-pay | Admitting: Obstetrics and Gynecology

## 2021-02-20 NOTE — Telephone Encounter (Signed)
Patient would like to have a f/u done from her STI screening.   CB# [phone]

## 2021-02-20 NOTE — Telephone Encounter (Signed)
Patient aware will do TOC at ob appt

## 2021-03-01 ENCOUNTER — Other Ambulatory Visit: Payer: Self-pay

## 2021-03-01 ENCOUNTER — Ambulatory Visit (INDEPENDENT_AMBULATORY_CARE_PROVIDER_SITE_OTHER): Payer: BC Managed Care – PPO | Admitting: Obstetrics and Gynecology

## 2021-03-01 VITALS — BP 108/74 | HR 101 | Wt 183.0 lb

## 2021-03-01 DIAGNOSIS — Z3A28 28 weeks gestation of pregnancy: Secondary | ICD-10-CM

## 2021-03-01 DIAGNOSIS — A5901 Trichomonal vulvovaginitis: Secondary | ICD-10-CM

## 2021-03-01 DIAGNOSIS — Z3400 Encounter for supervision of normal first pregnancy, unspecified trimester: Secondary | ICD-10-CM

## 2021-03-01 DIAGNOSIS — O23591 Infection of other part of genital tract in pregnancy, first trimester: Secondary | ICD-10-CM

## 2021-03-01 NOTE — Progress Notes (Signed)
° °  PRENATAL VISIT NOTE  Subjective:  Kelly Meadows is a 21 y.o. G1P0 at [redacted]w[redacted]d being seen today for ongoing prenatal care.  She is currently monitored for the following issues for this low-risk pregnancy and has Supervision of normal first pregnancy, antepartum; Trichomonal vaginitis during pregnancy in first trimester; and Alpha thalassemia silent carrier on their problem list.  Patient reports no complaints.  Contractions: Not present. Vag. Bleeding: None.  Movement: Present. Denies leaking of fluid.   The following portions of the patient's history were reviewed and updated as appropriate: allergies, current medications, past family history, past medical history, past social history, past surgical history and problem list.   Objective:   Vitals:   03/01/21 0837  BP: 108/74  Pulse: (!) 101  Weight: 183 lb (83 kg)    Fetal Status: Fetal Heart Rate (bpm): 161 Fundal Height: 28 cm Movement: Present     General:  Alert, oriented and cooperative. Patient is in no acute distress.  Skin: Skin is warm and dry. No rash noted.   Cardiovascular: Normal heart rate noted  Respiratory: Normal respiratory effort, no problems with respiration noted  Abdomen: Soft, gravid, appropriate for gestational age.  Pain/Pressure: Absent     Pelvic: Cervical exam deferred        Extremities: Normal range of motion.  Edema: None  Mental Status: Normal mood and affect. Normal behavior. Normal judgment and thought content.   Assessment and Plan:  Pregnancy: G1P0 at [redacted]w[redacted]d 1. Supervision of normal first pregnancy, antepartum Routine care - Glucose Tolerance, 2 Hours w/1 Hour - CBC - RPR - HIV Antibody (routine testing w rflx)  2. [redacted] weeks gestation of pregnancy  3. Trichomonal vaginitis during pregnancy in first trimester Toc nv  Preterm labor symptoms and general obstetric precautions including but not limited to vaginal bleeding, contractions, leaking of fluid and fetal movement were reviewed in  detail with the patient. Please refer to After Visit Summary for other counseling recommendations.   Return in about 2 weeks (around 03/15/2021) for in person or virtual, md or app, low risk ob.  No future appointments.  Yonah Bing, MD

## 2021-03-02 LAB — GLUCOSE TOLERANCE, 2 HOURS W/ 1HR
Glucose, 1 hour: 117 mg/dL (ref 70–179)
Glucose, 2 hour: 81 mg/dL (ref 70–152)
Glucose, Fasting: 68 mg/dL — ABNORMAL LOW (ref 70–91)

## 2021-03-02 LAB — CBC
Hematocrit: 34.6 % (ref 34.0–46.6)
Hemoglobin: 11.5 g/dL (ref 11.1–15.9)
MCH: 28.3 pg (ref 26.6–33.0)
MCHC: 33.2 g/dL (ref 31.5–35.7)
MCV: 85 fL (ref 79–97)
Platelets: 159 10*3/uL (ref 150–450)
RBC: 4.06 x10E6/uL (ref 3.77–5.28)
RDW: 13.2 % (ref 11.7–15.4)
WBC: 10.6 10*3/uL (ref 3.4–10.8)

## 2021-03-02 LAB — HIV ANTIBODY (ROUTINE TESTING W REFLEX): HIV Screen 4th Generation wRfx: NONREACTIVE

## 2021-03-02 LAB — RPR: RPR Ser Ql: NONREACTIVE

## 2021-03-06 ENCOUNTER — Other Ambulatory Visit: Payer: Self-pay | Admitting: *Deleted

## 2021-03-06 MED ORDER — HYDROCORTISONE (PERIANAL) 2.5 % EX CREA: TOPICAL_CREAM | Freq: Two times a day (BID) | CUTANEOUS | 2 refills | Status: DC

## 2021-03-06 MED ORDER — HYDROCORTISONE ACETATE 25 MG RE SUPP: 25.0000 mg | Freq: Two times a day (BID) | RECTAL | 1 refills | Status: DC

## 2021-03-06 MED ORDER — TERCONAZOLE 0.8 % VA CREA: 1.0000 | TOPICAL_CREAM | Freq: Every day | VAGINAL | 0 refills | Status: DC

## 2021-03-08 ENCOUNTER — Encounter (HOSPITAL_COMMUNITY): Payer: Self-pay | Admitting: Obstetrics & Gynecology

## 2021-03-08 ENCOUNTER — Inpatient Hospital Stay (HOSPITAL_COMMUNITY)
Admission: AD | Admit: 2021-03-08 | Discharge: 2021-03-08 | Disposition: A | Discharge status: Home / Self Care | Payer: BC Managed Care – PPO | Attending: Obstetrics & Gynecology | Admitting: Obstetrics & Gynecology

## 2021-03-08 DIAGNOSIS — Z3689 Encounter for other specified antenatal screening: Secondary | ICD-10-CM | POA: Insufficient documentation

## 2021-03-08 DIAGNOSIS — K625 Hemorrhage of anus and rectum: Secondary | ICD-10-CM | POA: Diagnosis not present

## 2021-03-08 DIAGNOSIS — O98313 Other infections with a predominantly sexual mode of transmission complicating pregnancy, third trimester: Secondary | ICD-10-CM | POA: Diagnosis not present

## 2021-03-08 DIAGNOSIS — A5901 Trichomonal vulvovaginitis: Secondary | ICD-10-CM | POA: Insufficient documentation

## 2021-03-08 DIAGNOSIS — D563 Thalassemia minor: Secondary | ICD-10-CM

## 2021-03-08 DIAGNOSIS — Z3A29 29 weeks gestation of pregnancy: Secondary | ICD-10-CM | POA: Diagnosis not present

## 2021-03-08 DIAGNOSIS — O99613 Diseases of the digestive system complicating pregnancy, third trimester: Secondary | ICD-10-CM | POA: Diagnosis not present

## 2021-03-08 DIAGNOSIS — O2243 Hemorrhoids in pregnancy, third trimester: Secondary | ICD-10-CM | POA: Insufficient documentation

## 2021-03-08 DIAGNOSIS — Z3400 Encounter for supervision of normal first pregnancy, unspecified trimester: Secondary | ICD-10-CM

## 2021-03-08 LAB — WET PREP, GENITAL
Clue Cells Wet Prep HPF POC: NONE SEEN
Sperm: NONE SEEN
Trich, Wet Prep: NONE SEEN
WBC, Wet Prep HPF POC: >=10 — AB (ref <10)
Yeast Wet Prep HPF POC: NONE SEEN

## 2021-03-08 NOTE — Discharge Instructions (Signed)
Your test of cure for trichomonas was negative today.

## 2021-03-08 NOTE — MAU Note (Signed)
.  Kelly Meadows is a 21 y.o. at [redacted]w[redacted]d here in MAU reporting: an hour ago noticed bleeding when she wiped. States now she is unsure if it is vaginal bleeding or from her hemorrhoids. Endorses fetal movement. No pain.

## 2021-03-08 NOTE — MAU Provider Note (Cosign Needed)
History     CSN: [id number]  Arrival date and time: 03/08/21 1227   Event Date/Time   First Provider Initiated Contact with Patient 03/08/21 1311      Chief Complaint  Patient presents with   Vaginal Bleeding   [name] is a 21 y.o. year old G76P0 female at [redacted]w[redacted]d weeks gestation who presents to MAU reporting she noticed blood when she wiped about 1 hour prior to arriving at MAU. Now she is unsure if the bleeding is vaginal or rectal; has dx of hemorrhoids. She reports (+) FM. She denies any pain. She has been prescribed creams and suppositories for hemorrhoids, but does not use them. She reports she did pick up Rx for the suppositories, "because it was hurting too bad and I was scared to insert anything." She [facility]   OB History     Gravida  1   Para      Term      Preterm      AB      Living         SAB      IAB      Ectopic      Multiple      Live Births              Past Medical History:  Diagnosis Date   Medical history non-contributory     Past Surgical History:  Procedure Laterality Date   NO PAST SURGERIES      Family History  Problem Relation Age of Onset   Breast cancer Mother     Social History   Tobacco Use   Smoking status: Never   Smokeless tobacco: Never  Vaping Use   Vaping Use: Former   Start date: 08/23/2020  Substance Use Topics   Alcohol use: Not Currently    Alcohol/week: 1.0 standard drink    Types: 1 Standard drinks or equivalent per week   Drug use: Not Currently    Types: Marijuana    Comment: Stopped approx 1-2 months ago (03/31/20)    Allergies: No Known Allergies  Medications Prior to Admission  Medication Sig Dispense Refill Last Dose   hydrocortisone (ANUSOL-HC) 25 MG suppository Place 1 suppository (25 mg total) rectally 2 (two) times daily. 12 suppository 1    Prenatal Vit-Fe Fumarate-FA (MULTIVITAMIN-PRENATAL) 27-0.8 MG TABS tablet Take 1 tablet by mouth daily at 12  noon.   03/07/2021   terconazole (TERAZOL 3) 0.8 % vaginal cream Place 1 applicator vaginally at bedtime. Apply nightly for three nights. 20 g 0    hydrocortisone (ANUSOL-HC) 2.5 % rectal cream Place rectally 2 (two) times daily. 30 g 2     Review of Systems  Constitutional: Negative.   HENT: Negative.    Eyes: Negative.   Respiratory: Negative.    Cardiovascular: Negative.   Gastrointestinal:  Negative for anal bleeding (bleeding with wiping; unsure if vaginal or rectal).  Endocrine: Negative.   Genitourinary:  Negative for vaginal bleeding (bleeding with wiping; unsure if vaginal or rectal).  Musculoskeletal: Negative.   Skin: Negative.   Allergic/Immunologic: Negative.   Neurological: Negative.   Hematological: Negative.   Psychiatric/Behavioral: Negative.    Physical Exam   Blood pressure 106/65, pulse 93, temperature 98.6 F (37 C), temperature source Oral, resp. rate 15, last menstrual period 07/18/2020, SpO2 98 %.  Physical Exam Vitals and nursing note reviewed. Exam conducted with a chaperone present.  Constitutional:  Appearance: Normal appearance. She is normal weight.  Cardiovascular:     Rate and Rhythm: Normal rate.  Pulmonary:     Effort: Pulmonary effort is normal.  Abdominal:     Palpations: Abdomen is soft.  Genitourinary:    General: Normal vulva.     Comments: Pelvic exam: External genitalia normal, SE: vaginal walls pink and well rugated, cervix is smooth, pink, no lesions, moderate amt of thin, yellowish-white vaginal d/c -- WP, GC/CT done, cervix visually closed, Uterus is non-tender, S=D, no CMT or friability, no adnexal tenderness. Musculoskeletal:        General: Normal range of motion.  Skin:    General: Skin is warm and dry.  Neurological:     Mental Status: She is alert and oriented to person, place, and time.  Psychiatric:        Mood and Affect: Mood normal.        Behavior: Behavior normal.        Thought Content: Thought content  normal.        Judgment: Judgment normal.   REACTIVE NST - FHR: 150 bpm / moderate variability / accels present / decels absent / TOCO: UI noted   MAU Course  Procedures  MDM Wet Prep GC/CT -- Results pending   Results for orders placed or performed during the hospital encounter of 03/08/21 (from the past 24 hour(s))  Wet prep, genital     Status: Abnormal   Collection Time: 03/08/21  1:18 PM  Result Value Ref Range   Yeast Wet Prep HPF POC NONE SEEN NONE SEEN   Trich, Wet Prep NONE SEEN NONE SEEN   Clue Cells Wet Prep HPF POC NONE SEEN NONE SEEN   WBC, Wet Prep HPF POC >=10 (A) <10   Sperm NONE SEEN     Assessment and Plan  Rectal bleeding  - Advised to use hemorrhoid medications prescribed  [redacted] weeks gestation of pregnancy  Trichomonal vaginitis during pregnancy in third trimester  - Negative TOC - Reviewed safe sex practices  - Discharge patient - Keep scheduled appts with [facility] - Patient verbalized an understanding of the plan of care and agrees.    Laury Deep, CNM 03/08/2021, 1:11 PM

## 2021-03-09 LAB — GC/CHLAMYDIA PROBE AMP (~~LOC~~) NOT AT ARMC
Chlamydia: NEGATIVE
Comment: NEGATIVE
Comment: NORMAL
Neisseria Gonorrhea: NEGATIVE

## 2021-03-13 ENCOUNTER — Other Ambulatory Visit: Payer: Self-pay

## 2021-03-13 ENCOUNTER — Ambulatory Visit (INDEPENDENT_AMBULATORY_CARE_PROVIDER_SITE_OTHER): Payer: BC Managed Care – PPO | Admitting: Family Medicine

## 2021-03-13 ENCOUNTER — Encounter: Payer: Self-pay | Admitting: Family Medicine

## 2021-03-13 VITALS — BP 129/70 | HR 90 | Wt 186.0 lb

## 2021-03-13 DIAGNOSIS — Z3400 Encounter for supervision of normal first pregnancy, unspecified trimester: Secondary | ICD-10-CM

## 2021-03-13 DIAGNOSIS — A5901 Trichomonal vulvovaginitis: Secondary | ICD-10-CM

## 2021-03-13 DIAGNOSIS — O23591 Infection of other part of genital tract in pregnancy, first trimester: Secondary | ICD-10-CM

## 2021-03-13 DIAGNOSIS — D563 Thalassemia minor: Secondary | ICD-10-CM

## 2021-03-13 NOTE — Progress Notes (Signed)
Patient presents for ROB. FHT 155. Reports no concerns at this time.  Wants to know if she could have an extra ultrasound so she can "see how much her son weighs."

## 2021-03-13 NOTE — Progress Notes (Signed)
° °  PRENATAL VISIT NOTE  Subjective:  Kelly Meadows is a 21 y.o. G1P0 at 46w1dbeing seen today for ongoing prenatal care.  She is currently monitored for the following issues for this low-risk pregnancy and has Supervision of normal first pregnancy, antepartum; Trichomonal vaginitis during pregnancy in first trimester; and Alpha thalassemia silent carrier on their problem list.  Patient reports no complaints.  Met with [name] today. She also wants to know about checking the size of the baby.  Contractions: Not present. Vag. Bleeding: None.  Movement: Present. Denies leaking of fluid.   The following portions of the patient's history were reviewed and updated as appropriate: allergies, current medications, past family history, past medical history, past social history, past surgical history and problem list.   Objective:   Vitals:   03/13/21 1602  BP: 129/70  Pulse: 90  Weight: 186 lb (84.4 kg)    Fetal Status: Fetal Heart Rate (bpm): 155 Fundal Height: 30 cm Movement: Present     General:  Alert, oriented and cooperative. Patient is in no acute distress.  Skin: Skin is warm and dry. No rash noted.   Cardiovascular: Normal heart rate noted  Respiratory: Normal respiratory effort, no problems with respiration noted  Abdomen: Soft, gravid, appropriate for gestational age.  Pain/Pressure: Absent     Pelvic: Cervical exam deferred        Extremities: Normal range of motion.  Edema: None  Mental Status: Normal mood and affect. Normal behavior. Normal judgment and thought content.   Assessment and Plan:  Pregnancy: G1P0 at 325w1d1. Supervision of normal first pregnancy, antepartum Up to date [place]. [facility] and gave patient copy of the consent form.  Reviewed most common reasons for WB exclusion including but not limited to availability of tub, hypertension, need for  pitocin, excessive bleeding  2. Trichomonal vaginitis during pregnancy in first trimester TOC was negative  3. Alpha thalassemia silent carrier    Preterm labor symptoms and general obstetric precautions including but not limited to vaginal bleeding, contractions, leaking of fluid and fetal movement were reviewed in detail with the patient. Please refer to After Visit Summary for other counseling recommendations.   Return in about 2 weeks (around 03/27/2021) for Routine prenatal care.  Future Appointments  Date Time Provider [place]3/13/2023  2:15 PM ArWoodroe ModeMD [facility]3/23/2023 10:35 AM WeDarlina RumpfCNM [facility]  04/20/2021 10:35 AM WeDarlina RumpfCNM [facility]  04/27/2021  4:10 PM PiAletha HalimMD [facility]  05/04/2021  9:55 AM WeDarlina RumpfCNM [facility]  05/08/2021  3:30 PM PrDonnamae JudeMD [facility]  05/18/2021  3:50 PM WeDarlina RumpfCNM [facility]  05/25/2021  3:50 PM Anyanwu, UgSallyanne HaversMD [facility]    KiCaren MacadamMD

## 2021-03-13 NOTE — Patient Instructions (Signed)
?  Considering Waterbirth? ?Guide for patients at [facility] ([facility]) ?Why consider waterbirth? ?Gentle birth for babies  ?Less pain medicine used in labor  ?May allow for passive descent/less pushing  ?May reduce perineal tears  ?More mobility and instinctive maternal position changes  ?Increased maternal relaxation  ? ?Is waterbirth safe? What are the risks of infection, drowning or other complications? ?Infection:  ?Very low risk (3.7 % for tub vs 4.8% for bed)  ?7 in 8000 waterbirths with documented infection  ?Poorly cleaned equipment most common cause  ?Slightly lower group B strep transmission rate  ?Drowning  ?Maternal:  ?Very low risk  ?Related to seizures or fainting  ?Newborn:  ?Very low risk. No evidence of increased risk of respiratory problems in multiple large studies  ?Physiological protection from breathing under water  ?Avoid underwater birth if there are any fetal complications  ?Once baby's head is out of the water, keep it out.  ?Birth complication  ?Some reports of cord trauma, but risk decreased by bringing baby to surface gradually  ?No evidence of increased risk of shoulder dystocia. Mothers can usually change positions faster in water than in a bed, possibly aiding the maneuvers to free the shoulder.  ? ?There are 2 things you MUST do to have a waterbirth with [facility]: ?Attend a waterbirth class at [facility] [place]   ?3rd Wednesday of every month from 7-9 pm (virtual during COVID) ?Free ?Register online at [email or web address] or [email or web address] or by calling [phone] ?Bring us the certificate from the class to your prenatal appointment or send via MyChart ?Meet with a midwife at 36 weeks* to see if you can still plan a waterbirth and to sign the consent.  ? ?*We also recommend that you schedule as many of your prenatal visits with a midwife as possible.   ? ?Helpful information: ?You may want to bring a bathing suit top to the hospital  to wear during labor but this is optional.  All other supplies are provided by the hospital. ?Please arrive at the hospital with signs of active labor, and do not wait at home until late in labor. It takes 45 min- 2 hours for COVID testing, fetal monitoring, and check in to your room to take place, plus transport and filling of the waterbirth tub.   ? ?Things that would prevent you from having a waterbirth: ?Unknown or Positive COVID-19 diagnosis upon admission to hospital* ?Premature, <37wks  ?Previous cesarean birth  ?Presence of thick meconium-stained fluid  ?Multiple gestation (Twins, triplets, etc.)  ?Uncontrolled diabetes or gestational diabetes requiring medication  ?Hypertension diagnosed in pregnancy or preexisting hypertension (gestational hypertension, preeclampsia, or chronic hypertension) ?Fetal growth restriction (your baby measures less than 10th percentile on ultrasound) ?Heavy vaginal bleeding  ?Non-reassuring fetal heart rate  ?Active infection (MRSA, etc.). Group B Strep is NOT a contraindication for waterbirth.  ?If your labor has to be induced and induction method requires continuous monitoring of the baby's heart rate  ?Other risks/issues identified by your obstetrical provider  ? ?Please remember that birth is unpredictable. Under certain unforeseeable circumstances your provider may advise against giving birth in the tub. These decisions will be made on a case-by-case basis and with the safety of you and your baby as our highest priority. ? ? ?*Please remember that in order to have a waterbirth, you must test Negative to COVID-19 upon admission to the hospital. ? ?Updated 04/25/20 ? ?

## 2021-03-15 DIAGNOSIS — Z419 Encounter for procedure for purposes other than remedying health state, unspecified: Secondary | ICD-10-CM | POA: Diagnosis not present

## 2021-03-17 ENCOUNTER — Encounter: Payer: Self-pay | Admitting: Family Medicine

## 2021-03-18 ENCOUNTER — Inpatient Hospital Stay (HOSPITAL_COMMUNITY)
Admission: AD | Admit: 2021-03-18 | Discharge: 2021-03-18 | Disposition: A | Discharge status: Home / Self Care | Payer: BC Managed Care – PPO | Attending: Obstetrics and Gynecology | Admitting: Obstetrics and Gynecology

## 2021-03-18 ENCOUNTER — Other Ambulatory Visit: Payer: Self-pay

## 2021-03-18 DIAGNOSIS — N898 Other specified noninflammatory disorders of vagina: Secondary | ICD-10-CM

## 2021-03-18 DIAGNOSIS — Z3A30 30 weeks gestation of pregnancy: Secondary | ICD-10-CM | POA: Diagnosis not present

## 2021-03-18 DIAGNOSIS — O26893 Other specified pregnancy related conditions, third trimester: Secondary | ICD-10-CM | POA: Diagnosis present

## 2021-03-18 LAB — WET PREP, GENITAL
Sperm: NONE SEEN
Trich, Wet Prep: NONE SEEN
WBC, Wet Prep HPF POC: >=10 — AB (ref <10)
Yeast Wet Prep HPF POC: NONE SEEN

## 2021-03-18 NOTE — MAU Note (Signed)
Pt reports to mau with c/o green/yellow dc for the past few days.  Pt reports slight odor.  Denies vag bleeding or itching.  Denies CTX or LOF. +FM  FHR 165 in triage.  ?

## 2021-03-20 ENCOUNTER — Encounter: Payer: Self-pay | Admitting: Family Medicine

## 2021-03-20 LAB — GC/CHLAMYDIA PROBE AMP (~~LOC~~) NOT AT ARMC
Chlamydia: NEGATIVE
Comment: NEGATIVE
Comment: NORMAL
Neisseria Gonorrhea: NEGATIVE

## 2021-03-21 ENCOUNTER — Other Ambulatory Visit: Payer: Self-pay | Admitting: *Deleted

## 2021-03-21 MED ORDER — METRONIDAZOLE 500 MG PO TABS: 500.0000 mg | ORAL_TABLET | Freq: Two times a day (BID) | ORAL | 0 refills | Status: DC

## 2021-03-27 ENCOUNTER — Encounter: Payer: BC Managed Care – PPO | Admitting: Family Medicine

## 2021-03-27 ENCOUNTER — Encounter: Payer: BC Managed Care – PPO | Admitting: Obstetrics & Gynecology

## 2021-03-27 ENCOUNTER — Encounter: Payer: Self-pay | Admitting: Family Medicine

## 2021-04-05 ENCOUNTER — Encounter: Payer: Self-pay | Admitting: Family Medicine

## 2021-04-05 ENCOUNTER — Encounter (HOSPITAL_COMMUNITY): Payer: Self-pay | Admitting: Obstetrics & Gynecology

## 2021-04-05 ENCOUNTER — Other Ambulatory Visit: Payer: Self-pay

## 2021-04-05 ENCOUNTER — Ambulatory Visit (INDEPENDENT_AMBULATORY_CARE_PROVIDER_SITE_OTHER): Payer: BC Managed Care – PPO | Admitting: Family Medicine

## 2021-04-05 ENCOUNTER — Inpatient Hospital Stay (HOSPITAL_COMMUNITY)
Admission: AD | Admit: 2021-04-05 | Discharge: 2021-04-05 | Disposition: A | Discharge status: Home / Self Care | Payer: BC Managed Care – PPO | Attending: Obstetrics & Gynecology | Admitting: Obstetrics & Gynecology

## 2021-04-05 VITALS — BP 107/68 | HR 105 | Wt 192.0 lb

## 2021-04-05 DIAGNOSIS — B3731 Acute candidiasis of vulva and vagina: Secondary | ICD-10-CM | POA: Insufficient documentation

## 2021-04-05 DIAGNOSIS — Z3400 Encounter for supervision of normal first pregnancy, unspecified trimester: Secondary | ICD-10-CM

## 2021-04-05 DIAGNOSIS — Z3A33 33 weeks gestation of pregnancy: Secondary | ICD-10-CM | POA: Diagnosis not present

## 2021-04-05 DIAGNOSIS — O98813 Other maternal infectious and parasitic diseases complicating pregnancy, third trimester: Secondary | ICD-10-CM | POA: Insufficient documentation

## 2021-04-05 DIAGNOSIS — Z202 Contact with and (suspected) exposure to infections with a predominantly sexual mode of transmission: Secondary | ICD-10-CM | POA: Diagnosis not present

## 2021-04-05 LAB — WET PREP, GENITAL
Sperm: NONE SEEN
Trich, Wet Prep: NONE SEEN
WBC, Wet Prep HPF POC: >=10 — AB (ref <10)

## 2021-04-05 MED ORDER — TERCONAZOLE 0.4 % VA CREA: 1.0000 | TOPICAL_CREAM | Freq: Every day | VAGINAL | 0 refills | Status: DC

## 2021-04-05 MED ORDER — METRONIDAZOLE 500 MG PO TABS
2000.0000 mg | ORAL_TABLET | Freq: Once | ORAL | Status: AC
  Administered 2021-04-05: 2000 mg via ORAL
  Filled 2021-04-05: qty 4

## 2021-04-05 NOTE — Progress Notes (Signed)
? ?  PRENATAL VISIT NOTE ? ?Subjective:  ?Kelly Meadows is a 21 y.o. G1P0 at [redacted]w[redacted]d being seen today for ongoing prenatal care.  She is currently monitored for the following issues for this low-risk pregnancy and has Supervision of normal first pregnancy, antepartum; Trichomonal vaginitis during pregnancy in first trimester; and Alpha thalassemia silent carrier on their problem list. ? ?Patient reports no complaints.  Contractions: Not present. Vag. Bleeding: None.  Movement: Present. Denies leaking of fluid.  ? ?The following portions of the patient's history were reviewed and updated as appropriate: allergies, current medications, past family history, past medical history, past social history, past surgical history and problem list.  ? ?Objective:  ? ?Vitals:  ? 04/05/21 1536  ?BP: 107/68  ?Pulse: (!) 105  ?Weight: 192 lb (87.1 kg)  ? ? ?Fetal Status: Fetal Heart Rate (bpm): NST   Movement: Present    ? ?General:  Alert, oriented and cooperative. Patient is in no acute distress.  ?Skin: Skin is warm and dry. No rash noted.   ?Cardiovascular: Normal heart rate noted  ?Respiratory: Normal respiratory effort, no problems with respiration noted  ?Abdomen: Soft, gravid, appropriate for gestational age.  Pain/Pressure: Present     ?Pelvic: Cervical exam deferred        ?Extremities: Normal range of motion.  Edema: None  ?Mental Status: Normal mood and affect. Normal behavior. Normal judgment and thought content.  ? ?Assessment and Plan:  ?Pregnancy: G1P0 at [redacted]w[redacted]d ?1. Supervision of normal first pregnancy, antepartum ?Continue routine prenatal care. ? ? ? ?Preterm labor symptoms and general obstetric precautions including but not limited to vaginal bleeding, contractions, leaking of fluid and fetal movement were reviewed in detail with the patient. ?Please refer to After Visit Summary for other counseling recommendations.  ? ?Return in about 2 weeks (around 04/19/2021). ? ?Future Appointments  ?Date Time Provider  Department Center  ?04/17/2021  3:30 PM Federico Flake, MD [facility]  ?04/27/2021  4:10 PM Mission Bing, MD [facility]  ?05/01/2021  3:30 PM Federico Flake, MD [facility]  ?05/08/2021  3:30 PM Reva Bores, MD [facility]  ?05/18/2021  3:50 PM Calvert Cantor, CNM [facility]  ?05/25/2021  3:50 PM Anyanwu, Jethro Bastos, MD [facility]  ? ? ?Reva Bores, MD ? ?

## 2021-04-05 NOTE — MAU Provider Note (Signed)
?History  ?  ? ?CSN: [id number] ? ?Arrival date and time: 04/05/21 1302 ? ? Event Date/Time  ? First Provider Initiated Contact with Patient 04/05/21 1354   ?  ? ?Chief Complaint  ?Patient presents with  ? possible std exposure  ? ?HPI ?Kelly Meadows is a 21 y.o. G1P0 at [redacted]w[redacted]d who presents with vaginal irritation and requesting STD testing. She reports a woman told her that she has trichomoniasis and is sexually active with the patient's partner. She reports her vagina feels irritated and is requesting treatment for the exposure. She was last sexually active with her partner on Friday. She denies any bleeding. Denies any abdominal pain. Reports normal fetal movement.  ? ?OB History   ? ? Gravida  ?1  ? Para  ?   ? Term  ?   ? Preterm  ?   ? AB  ?   ? Living  ?   ?  ? ? SAB  ?   ? IAB  ?   ? Ectopic  ?   ? Multiple  ?   ? Live Births  ?   ?   ?  ?  ? ? ?Past Medical History:  ?Diagnosis Date  ? Medical history non-contributory   ? ? ?Past Surgical History:  ?Procedure Laterality Date  ? NO PAST SURGERIES    ? ? ?Family History  ?Problem Relation Age of Onset  ? Breast cancer Mother   ? ? ?Social History  ? ?Tobacco Use  ? Smoking status: Never  ? Smokeless tobacco: Never  ?Vaping Use  ? Vaping Use: Former  ? Start date: 08/23/2020  ?Substance Use Topics  ? Alcohol use: Not Currently  ?  Alcohol/week: 1.0 standard drink  ?  Types: 1 Standard drinks or equivalent per week  ? Drug use: Not Currently  ?  Types: Marijuana  ?  Comment: Stopped approx 1-2 months ago (03/31/20)  ? ? ?Allergies: No Known Allergies ? ?Medications Prior to Admission  ?Medication Sig Dispense Refill Last Dose  ? hydrocortisone (ANUSOL-HC) 25 MG suppository Place 1 suppository (25 mg total) rectally 2 (two) times daily. 12 suppository 1 More than a month  ? Prenatal Vit-Fe Fumarate-FA (MULTIVITAMIN-PRENATAL) 27-0.8 MG TABS tablet Take 1 tablet by mouth daily at 12 noon.   04/05/2021  ? hydrocortisone (ANUSOL-HC) 2.5 % rectal cream Place rectally 2  (two) times daily. 30 g 2 More than a month  ? metroNIDAZOLE (FLAGYL) 500 MG tablet Take 1 tablet (500 mg total) by mouth 2 (two) times daily. 14 tablet 0 More than a month  ? ? ?Review of Systems  ?Constitutional: Negative.  Negative for fatigue and fever.  ?HENT: Negative.    ?Respiratory: Negative.  Negative for shortness of breath.   ?Cardiovascular: Negative.  Negative for chest pain.  ?Gastrointestinal: Negative.  Negative for abdominal pain, constipation, diarrhea, nausea and vomiting.  ?Genitourinary:  Positive for vaginal pain. Negative for dysuria, vaginal bleeding and vaginal discharge.  ?Neurological: Negative.  Negative for dizziness and headaches.  ?Physical Exam  ? ?Blood pressure 112/62, pulse 100, temperature 98.3 ?F (36.8 ?C), temperature source Oral, resp. rate 16, height 5\' 5"  (1.651 m), weight 86.8 kg, last menstrual period 07/18/2020, SpO2 98 %. ? ?Physical Exam ?Vitals and nursing note reviewed.  ?Constitutional:   ?   General: She is not in acute distress. ?   Appearance: She is well-developed.  ?HENT:  ?   Head: Normocephalic.  ?Eyes:  ?   Pupils:  Pupils are equal, round, and reactive to light.  ?Cardiovascular:  ?   Rate and Rhythm: Normal rate and regular rhythm.  ?   Heart sounds: Normal heart sounds.  ?Pulmonary:  ?   Effort: Pulmonary effort is normal. No respiratory distress.  ?   Breath sounds: Normal breath sounds.  ?Abdominal:  ?   General: Bowel sounds are normal. There is no distension.  ?   Palpations: Abdomen is soft.  ?   Tenderness: There is no abdominal tenderness.  ?Skin: ?   General: Skin is warm and dry.  ?Neurological:  ?   Mental Status: She is alert and oriented to person, place, and time.  ?Psychiatric:     ?   Mood and Affect: Mood normal.     ?   Behavior: Behavior normal.     ?   Thought Content: Thought content normal.     ?   Judgment: Judgment normal.  ? ?Fetal Tracing: ? ?Baseline: 150 ?Variability: moderate ?Accels: 15x15 ?Decels: none ? ?Toco: UI ? ? ?MAU  Course  ?Procedures ?Results for orders placed or performed during the hospital encounter of 04/05/21 (from the past 24 hour(s))  ?Wet prep, genital     Status: Abnormal  ? Collection Time: 04/05/21  2:10 PM  ? Specimen: Vaginal  ?Result Value Ref Range  ? Yeast Wet Prep HPF POC PRESENT (A) NONE SEEN  ? Trich, Wet Prep NONE SEEN NONE SEEN  ? Clue Cells Wet Prep HPF POC PRESENT (A) NONE SEEN  ? WBC, Wet Prep HPF POC >=10 (A) <10  ? Sperm NONE SEEN   ?  ?MDM ?Wet prep and gc/chlamydia ? ?Risks and benefits of treatment reviewed. Patient desires one time dose of treatment today. Discussed that it is likely too soon to know if she has been infected and recommended TOC testing again. Patient agreeable. ? ?Metronidazole 2g ? ?Assessment and Plan  ? ?1. Exposure to trichomonas   ?2. [redacted] weeks gestation of pregnancy   ?3. Candidiasis of vagina during pregnancy   ? ?-Discharge home in stable condition ?-Rx for terazol sent to patient's pharmacy ?-STI precautions discussed ?-Patient advised to follow-up with OB as scheduled today for prenatal care ?-Patient may return to MAU as needed or if her condition were to change or worsen ? ? ?Wende Mott CNM ?04/05/2021, 1:54 PM  ?

## 2021-04-05 NOTE — Discharge Instructions (Signed)

## 2021-04-05 NOTE — MAU Note (Signed)
Kelly Meadows is a 21 y.o. at [redacted]w[redacted]d here in MAU reporting: came in contact with trich.  Trying to get swabbed or treated. Having itching, denies d/c- saw a little blood one time when she wiped this morning. No pain.  Reports +FM ? ?Onset of complaint: 3/17 ?Pain score: none ?Vitals:  ? 04/05/21 1335  ?BP: 102/67  ?Pulse: (!) 108  ?Resp: 16  ?Temp: 98.3 ?F (36.8 ?C)  ?SpO2: 98%  ?   ?WUJ:WJXBJY due to clothing ?Lab orders placed from triage: none  ? ?

## 2021-04-06 ENCOUNTER — Encounter: Payer: BC Managed Care – PPO | Admitting: Advanced Practice Midwife

## 2021-04-06 LAB — GC/CHLAMYDIA PROBE AMP (~~LOC~~) NOT AT ARMC
Chlamydia: NEGATIVE
Comment: NEGATIVE
Comment: NORMAL
Neisseria Gonorrhea: NEGATIVE

## 2021-04-07 ENCOUNTER — Encounter (HOSPITAL_COMMUNITY): Payer: Self-pay | Admitting: Obstetrics & Gynecology

## 2021-04-07 ENCOUNTER — Inpatient Hospital Stay (HOSPITAL_COMMUNITY)
Admission: AD | Admit: 2021-04-07 | Discharge: 2021-04-07 | Disposition: A | Discharge status: Home / Self Care | Payer: BC Managed Care – PPO | Attending: Obstetrics & Gynecology | Admitting: Obstetrics & Gynecology

## 2021-04-07 ENCOUNTER — Inpatient Hospital Stay (HOSPITAL_COMMUNITY): Payer: BC Managed Care – PPO

## 2021-04-07 ENCOUNTER — Inpatient Hospital Stay (HOSPITAL_BASED_OUTPATIENT_CLINIC_OR_DEPARTMENT_OTHER): Payer: BC Managed Care – PPO

## 2021-04-07 DIAGNOSIS — Z202 Contact with and (suspected) exposure to infections with a predominantly sexual mode of transmission: Secondary | ICD-10-CM

## 2021-04-07 DIAGNOSIS — Z3689 Encounter for other specified antenatal screening: Secondary | ICD-10-CM | POA: Insufficient documentation

## 2021-04-07 DIAGNOSIS — O26893 Other specified pregnancy related conditions, third trimester: Secondary | ICD-10-CM | POA: Insufficient documentation

## 2021-04-07 DIAGNOSIS — R1030 Lower abdominal pain, unspecified: Secondary | ICD-10-CM | POA: Diagnosis not present

## 2021-04-07 DIAGNOSIS — O4693 Antepartum hemorrhage, unspecified, third trimester: Secondary | ICD-10-CM

## 2021-04-07 DIAGNOSIS — Z148 Genetic carrier of other disease: Secondary | ICD-10-CM | POA: Diagnosis not present

## 2021-04-07 DIAGNOSIS — Z3A33 33 weeks gestation of pregnancy: Secondary | ICD-10-CM | POA: Diagnosis not present

## 2021-04-07 DIAGNOSIS — N930 Postcoital and contact bleeding: Secondary | ICD-10-CM

## 2021-04-07 DIAGNOSIS — Z3A34 34 weeks gestation of pregnancy: Secondary | ICD-10-CM | POA: Insufficient documentation

## 2021-04-07 LAB — CBC
HCT: 34.9 % — ABNORMAL LOW (ref 36.0–46.0)
Hemoglobin: 11.4 g/dL — ABNORMAL LOW (ref 12.0–15.0)
MCH: 28 pg (ref 26.0–34.0)
MCHC: 32.7 g/dL (ref 30.0–36.0)
MCV: 85.7 fL (ref 80.0–100.0)
Platelets: 172 10*3/uL (ref 150–400)
RBC: 4.07 MIL/uL (ref 3.87–5.11)
RDW: 13.2 % (ref 11.5–15.5)
WBC: 11.8 10*3/uL — ABNORMAL HIGH (ref 4.0–10.5)
nRBC: 0 % (ref 0.0–0.2)

## 2021-04-07 LAB — URINALYSIS, ROUTINE W REFLEX MICROSCOPIC
Bilirubin Urine: NEGATIVE
Glucose, UA: NEGATIVE mg/dL
Ketones, ur: NEGATIVE mg/dL
Nitrite: NEGATIVE
Protein, ur: NEGATIVE mg/dL
RBC / HPF: >50 RBC/hpf — ABNORMAL HIGH (ref 0–5)
Specific Gravity, Urine: 1.012 (ref 1.005–1.030)
pH: 7 (ref 5.0–8.0)

## 2021-04-07 MED ORDER — LACTATED RINGERS IV BOLUS
1000.0000 mL | Freq: Once | INTRAVENOUS | Status: AC
  Administered 2021-04-07: 1000 mL via INTRAVENOUS

## 2021-04-07 MED ORDER — NIFEDIPINE 10 MG PO CAPS
10.0000 mg | ORAL_CAPSULE | ORAL | Status: AC | PRN
  Administered 2021-04-07 (×3): 10 mg via ORAL
  Filled 2021-04-07 (×3): qty 1

## 2021-04-07 NOTE — MAU Note (Addendum)
.  Kelly Meadows is a 21 y.o. at [redacted]w[redacted]d here in MAU reporting: lower abdominal pain that comes and goes and bleeding that started x2 hours ago after intercourse. States the bleeding is bright red like the beginning of a period. Currently being treated for a yeast infection. Also reports intermittent pelvic pressure. +FM. Denies LOF. ? ?Pain score: 4 ?Vitals:  ? 04/07/21 1651  ?BP: 107/71  ?Pulse: (!) 112  ?Resp: 17  ?Temp: 98.5 ?F (36.9 ?C)  ?   ? ?Lab orders placed from triage:  UA ?

## 2021-04-07 NOTE — MAU Provider Note (Addendum)
?History  ?  ? ?CSN: [id number] ? ?Arrival date and time: 04/07/21 1628 ? ? Event Date/Time  ? First Provider Initiated Contact with Patient 04/07/21 1706   ?  ? ?Chief Complaint  ?Patient presents with  ? Vaginal Bleeding  ? Abdominal Pain  ? ?HPI ?Kelly Meadows is a 21 y.o. G1P0 at [redacted]w[redacted]d who presents to MAU with chief complaint of postcoital bleeding and lower abdominal cramping. Patient had sexual intercourse about two hours ago. Her dark red vaginal bleeding started immediately afterwards. She does not believe she bled at all during interourse. ? ?Patient also reports lower abdominal pain, onset coinciding with onset of bleeding. Pain score 4/10. She denies aggravating or alleviating factors. She has not taken medication for this complaint. ? ?Patient's pregnancy is c/b known Trichomonas exposure via today's sexual partner. She does not know his testing status. Intercourse was unprotected.  ? ?Patient receives prenatal care with [facility]. ? ?OB History   ? ? Gravida  ?1  ? Para  ?   ? Term  ?   ? Preterm  ?   ? AB  ?   ? Living  ?   ?  ? ? SAB  ?   ? IAB  ?   ? Ectopic  ?   ? Multiple  ?   ? Live Births  ?   ?   ?  ?  ? ? ?Past Medical History:  ?Diagnosis Date  ? Medical history non-contributory   ? ? ?Past Surgical History:  ?Procedure Laterality Date  ? NO PAST SURGERIES    ? ? ?Family History  ?Problem Relation Age of Onset  ? Breast cancer Mother   ? Vision loss Father   ? ? ?Social History  ? ?Tobacco Use  ? Smoking status: Never  ? Smokeless tobacco: Never  ?Vaping Use  ? Vaping Use: Former  ? Start date: 08/23/2020  ?Substance Use Topics  ? Alcohol use: Not Currently  ?  Alcohol/week: 1.0 standard drink  ?  Types: 1 Standard drinks or equivalent per week  ? Drug use: Not Currently  ?  Types: Marijuana  ?  Comment: Stopped approx 1-2 months ago (03/31/20)  ? ? ?Allergies: No Known Allergies ? ?Medications Prior to Admission  ?Medication Sig Dispense Refill Last Dose  ? Prenatal Vit-Fe Fumarate-FA  (MULTIVITAMIN-PRENATAL) 27-0.8 MG TABS tablet Take 1 tablet by mouth daily at 12 noon.   04/06/2021  ? terconazole (TERAZOL 7) 0.4 % vaginal cream Place 1 applicator vaginally at bedtime. 45 g 0 04/06/2021  ? hydrocortisone (ANUSOL-HC) 2.5 % rectal cream Place rectally 2 (two) times daily. 30 g 2   ? hydrocortisone (ANUSOL-HC) 25 MG suppository Place 1 suppository (25 mg total) rectally 2 (two) times daily. 12 suppository 1   ? ? ?Review of Systems  ?Gastrointestinal:  Positive for abdominal pain.  ?Genitourinary:  Positive for vaginal bleeding.  ?All other systems reviewed and are negative. ?Physical Exam  ? ?Blood pressure 107/71, pulse (!) 112, temperature 98.5 ?F (36.9 ?C), temperature source Oral, resp. rate 17, last menstrual period 07/18/2020. ? ?Physical Exam ?Vitals and nursing note reviewed. Exam conducted with a chaperone present.  ?Constitutional:   ?   Appearance: She is well-developed. She is not ill-appearing.  ?Cardiovascular:  ?   Rate and Rhythm: Normal rate and regular rhythm.  ?   Heart sounds: Normal heart sounds.  ?Pulmonary:  ?   Effort: Pulmonary effort is normal.  ?   Breath  sounds: Normal breath sounds.  ?Abdominal:  ?   Tenderness: There is no right CVA tenderness or left CVA tenderness.  ?   Comments: Gravid  ?Genitourinary: ?   Comments: Pelvic exam: External genitalia normal, vaginal walls pink and well rugated, cervix visually closed, no lesions noted. Dark red dried blood just internal to introitus. Removed with fox swab x 1. No active bleeding noted following use of swab. ? ? ?Skin: ?   General: Skin is warm and dry.  ?Neurological:  ?   Mental Status: She is alert.  ? ? ?MAU Course  ?Procedures ? ?MDM ? ?--EMR reviewed. Anterior placenta, normal cervical length ?--Treated for presumptive Trichomonas contact 04/05/2021 in MAU. Discussed re-exposure today. --Advised retesting at next OB visit, condom use for remainder of pregnancy, all partners presumptively treated ASAP ?--Reactive  tracing: baseline 150, mod var, +accels, no decels ?--Toco: q 2-6, improving with treatments given in MAU ?--1845: CNM returned to bedside. Patient denies pain.  ?--1950: CNM returned to bedside. Pain score 0/10. Cervix remains closed per digital exam. No bleeding noted on glove ?--Tracings from previous encounters reviewed. Patient consistently demonstrating contractions per toco with pain score of 0/10. ? ?Orders Placed This Encounter  ?Procedures  ? [place] MFM OB Limited  ? Urinalysis, Routine w reflex microscopic Urine, Clean Catch  ? CBC  ? Discharge patient  ? ? ?Patient Vitals for the past 24 hrs: ? BP Temp Temp src Pulse Resp  ?04/07/21 1852 126/71 -- -- 93 --  ?04/07/21 1813 113/71 -- -- 91 --  ?04/07/21 1743 (!) 126/97 -- -- -- --  ?04/07/21 1651 107/71 98.5 ?F (36.9 ?C) Oral (!) 112 17  ? ?Meds ordered this encounter  ?Medications  ? NIFEdipine (PROCARDIA) capsule 10 mg  ? lactated ringers bolus 1,000 mL  ? ?Narrative & Impression  ?---------------------------------------------------------------------- ? OBSTETRICS REPORT                        (Signed Final 04/08/2021 10:23 am) ?---------------------------------------------------------------------- ?Patient Info ? ID #:       [id number]                          D.O.B.:  [birth year] (20 yrs) ? Name:       Kelly Meadows                Visit Date: 04/07/2021 05:29 pm ?---------------------------------------------------------------------- ?Performed By ? Attending:        Ma Rings MD         Ref. Address:      [address] ?                                                             [address] ? Performed By:     Marcellina Millin       [facility] ? [facility] ?---------------------------------------------------------------------- ?Orders ? #  Description                           Code        Ordered By ? 1  US MFM OB LIMITED                      [id number]    Hikaru Delorenzo ?                                                      Sharena Dibenedetto ?---------------------------------------------------------------------- ? #  Order #                     Accession #                Episode # ? 1  [id number]                   [id number]                 [id number] ?---------------------------------------------------------------------- ?Indications ? Vaginal bleeding in pregnancy, third trimester  O46.93 ? 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 carrier) ? Ovarian cyst (simple)                           O34.80 N83.20 ? [redacted] weeks gestation of pregnancy                 Z3A.33 ? LR NIPS - female;  Negative AFP ?---------------------------------------------------------------------- ?Fetal Evaluation ? Num Of Fetuses:          1 ? Fetal Heart Rate(bpm):   164 ? Cardiac Activity:        Observed ? Presentation:            Cephalic ? Placenta:                Anterior ? P. Cord Insertion:       Previously Visualized ? Amniotic Fluid ? AFI FV:      Within normal limits ? AFI Sum(cm)     %Tile       Largest Pocket(cm) ? 9.5             15          3.9 ? RUQ(cm)       RLQ(cm)       LUQ(cm)        LLQ(cm) ? 2.5           1.5           1.6            3.9 ? Comment:    No placental abruption or previa identified. ?---------------------------------------------------------------------- ?OB History ? Gravidity:    1         Term:   0        Prem:   0        SAB:   0 ? TOP:          0       Ectopic:  0        Living: 0 ?---------------------------------------------------------------------- ?Gestational Age ? LMP:  37w 4d        Date:  07/18/20                   EDD:   04/24/21 ? Best:          33w 5d     Det. By:  U/S C R L  (10/18/20)    EDD:   05/21/21 ?---------------------------------------------------------------------- ?Anatomy ? Diaphragm:             Appears normal         Kidneys:                Appear normal ? Stomach:               Appears normal, left   Bladder:                 Appears normal ?                        sided ?---------------------------------------------------------------------- ?Cervix Uterus Adnexa ? Cervix ? Not visualized (advanced GA >24wks) ?--------

## 2021-04-11 ENCOUNTER — Encounter: Payer: BC Managed Care – PPO | Admitting: Obstetrics & Gynecology

## 2021-04-11 ENCOUNTER — Encounter: Payer: Self-pay | Admitting: Family Medicine

## 2021-04-15 DIAGNOSIS — Z419 Encounter for procedure for purposes other than remedying health state, unspecified: Secondary | ICD-10-CM | POA: Diagnosis not present

## 2021-04-17 ENCOUNTER — Encounter: Payer: Self-pay | Admitting: Family Medicine

## 2021-04-17 ENCOUNTER — Ambulatory Visit (INDEPENDENT_AMBULATORY_CARE_PROVIDER_SITE_OTHER): Payer: BC Managed Care – PPO | Admitting: Family Medicine

## 2021-04-17 VITALS — BP 102/67 | HR 101 | Wt 194.0 lb

## 2021-04-17 DIAGNOSIS — Z23 Encounter for immunization: Secondary | ICD-10-CM

## 2021-04-17 DIAGNOSIS — A5901 Trichomonal vulvovaginitis: Secondary | ICD-10-CM

## 2021-04-17 DIAGNOSIS — O23591 Infection of other part of genital tract in pregnancy, first trimester: Secondary | ICD-10-CM

## 2021-04-17 DIAGNOSIS — D563 Thalassemia minor: Secondary | ICD-10-CM

## 2021-04-17 DIAGNOSIS — Z3400 Encounter for supervision of normal first pregnancy, unspecified trimester: Secondary | ICD-10-CM | POA: Diagnosis not present

## 2021-04-17 NOTE — Progress Notes (Signed)
? ?  PRENATAL VISIT NOTE ? ?Subjective:  ?Kelly Meadows is a 21 y.o. G1P0 at [redacted]w[redacted]d being seen today for ongoing prenatal care.  She is currently monitored for the following issues for this low-risk pregnancy and has Supervision of normal first pregnancy, antepartum; Trichomonal vaginitis during pregnancy in first trimester; and Alpha thalassemia silent carrier on their problem list. ? ?Patient reports no complaints.  Contractions: Not present. Vag. Bleeding: None.  Movement: Present. Denies leaking of fluid.  ? ?The following portions of the patient's history were reviewed and updated as appropriate: allergies, current medications, past family history, past medical history, past social history, past surgical history and problem list.  ? ?Objective:  ? ?Vitals:  ? 04/17/21 1602  ?BP: 102/67  ?Pulse: (!) 101  ?Weight: 194 lb (88 kg)  ? ? ?Fetal Status: Fetal Heart Rate (bpm): 134   Movement: Present    ? ?General:  Alert, oriented and cooperative. Patient is in no acute distress.  ?Skin: Skin is warm and dry. No rash noted.   ?Cardiovascular: Normal heart rate noted  ?Respiratory: Normal respiratory effort, no problems with respiration noted  ?Abdomen: Soft, gravid, appropriate for gestational age.  Pain/Pressure: Present     ?Pelvic: Cervical exam deferred        ?Extremities: Normal range of motion.  Edema: None  ?Mental Status: Normal mood and affect. Normal behavior. Normal judgment and thought content.  ? ?Assessment and Plan:  ?Pregnancy: G1P0 at [redacted]w[redacted]d ?1. Supervision of normal first pregnancy, antepartum ?Up to date ?Planning on Waterbirth and took the class 3/16 but has not had CNM visit or consent discussion fully though we talked about it on 2/27. She was supposed to meet with CNM Reita Cliche on 3/23 and 4/6 but did not attend that appt and went to MAU.  ?Reviewed need to have these in place prior to delivery ?Patient also interested in a doula and given list ?Encouraged her to take the Natural Childbirth  class as well to help with labor coping strategies.  ? ?2. Alpha thalassemia silent carrier ? ? ?Preterm labor symptoms and general obstetric precautions including but not limited to vaginal bleeding, contractions, leaking of fluid and fetal movement were reviewed in detail with the patient. ?Please refer to After Visit Summary for other counseling recommendations.  ? ?Return in about 1 week (around 04/24/2021) for Routine prenatal care. ? ?Future Appointments  ?Date Time Provider Department Center  ?04/27/2021  4:10 PM Sanborn Bing, MD [facility]  ?05/01/2021  3:30 PM Federico Flake, MD [facility]  ?05/08/2021  3:30 PM Reva Bores, MD [facility]  ?05/18/2021  3:50 PM Calvert Cantor, CNM [facility]  ?05/25/2021  3:50 PM Anyanwu, Jethro Bastos, MD [facility]  ? ? ?Federico Flake, MD ? ?

## 2021-04-17 NOTE — Progress Notes (Signed)
PT presents for ROB at 35.1 weeks. No concerns today.  ?Desires Tdap today.  ?

## 2021-04-17 NOTE — Patient Instructions (Signed)
We recommend childbirth education to help your cope and plan for labor.  ? ?[facility] has free or nearly free classes that you are able to sign up online. They have a combination of virtual and in person classes. Additionally they offer breastfeeding specific classes, infant CPR classes and classes for partners and grandparents. The classes are held on the [place] and [place] campuses. ? ? ?Childbirth Education Options: ? ?[facility] Childbirth Education: ?Classes can vary in availability and schedule is subject to change. For most up-to-date information please visit [email or web address] to review and register.  ?[email or web address]/ ? ? ?There are also many childbirth education books. The book entitled "The Birth Partner"  by Penny Simpkin is a text that is used to train doulas (trained birth support persons) and is a good resource for all families.  ? ?DOULA LIST  ? ?[facility]oul[facility]  Sierra Bizzell  [phone]  [email or web address]  ?[email or web address]  ?[facility] Doula Zula Price [phone]  [email or web address]   ?[facility]   Precious J. Bradley   [email or web address]   ???THE MOTHERLY DOULA?? Serenna Dawson   [phone]   [email or web address]   ?  ?[facility]oul[facility]  Evelyn Tinsley  [phone]    [email or web address] ?[email or web address]   ?[facility]  Angie Rosier     [phone]     [email or web address]   ?Rachel McMillen: Doula & Photographer   Rachel McMillen [phone]       [email or web address]  [email or web address]   ?[facility]  Amelia Mattocks [phone]   [email or web address]   ?[facility]  Tiffany Slade  [phone]  [email or web address]   ?[email or web address]   ?[facility]   Iris Jones    [phone]  [email or web address] ?[email or web address]   ?Mary Walt [place] Doula  Mary Walt  [phone] [email or web address] ?[email or web address]  ?[facility] D[facility]  Jessica Bower           ?Sarah Carter         ?Christina Flaherty       ?Lora Reynolds     [phone] [email or web address]  [email or web address]   ?[facility]   ?Ciara Foxx [phone] [email or web address]   ?[facility]  Robin StJohn     [phone]  [email or web address] ?[email or web address]  ?[facility] Doul[facility]  Jaden Millner     [phone]  [email or web address]  ?Facebook and IG [email or web address]  ? Bernadette Vereen  [phone] [email or web address]   ? Breanna Grant [phone] [email or web address]  ? Melissa Luck  [phone] [email or web address]    ? Madison Manson  [phone] [email or web address]   ?IG [email or web address]  ? Cierra Moore    [phone] [email or web address]  ? Jerilyn "Jeri" Free  [phone] [email or web address]   ? Mtende Roll  [phone] [email or web address]  ? Susie Williams   [email or web address]   ? Liz Chavez    [phone] [email or web address]    ? Jessica Ayivi  [phone] [email or web address]   ? Zarmena Woods  [phone] [email or web address] ?[email or web address]   ? Shayla Rhem    [phone]   ?[facility] Joie Morrison  [phone] [email or web address] ?[email or web address]  ?Doula Mama Kathryn Farrar [phone] [email or web address] ?[email or web address]  ?[facility] Joie Morrison  [phone] [email or web address] ?[email or web address]  ?[facility]      Beth Ann Martin [phone]  [email or web address]  ?[email or web address]  ? ShawnTina Harris-Jones  [phone] [email or web address]  ? Sharyn Gietzen [phone] [email or web address]  ? Carlee Henry [phone] [email or web address]  ? Leatrice Priest  [phone]  [email or web address]  ?[facility]  Terry Anderson  [phone] [email or web address]  ? Leslie King [phone] [email or web address]  ?MOOR Divine Myeka   Dunn  [email or web address]  ? T-sheana Turner [phone] [email or web address]  ? Maya Jackson [phone] [email or web address]  ? Juante Randleman [phone] [email or web address]  ?  ? ? ? ? ? ?

## 2021-04-20 ENCOUNTER — Telehealth (INDEPENDENT_AMBULATORY_CARE_PROVIDER_SITE_OTHER): Payer: BC Managed Care – PPO | Admitting: Advanced Practice Midwife

## 2021-04-20 ENCOUNTER — Encounter: Payer: BC Managed Care – PPO | Admitting: Advanced Practice Midwife

## 2021-04-20 DIAGNOSIS — Z3400 Encounter for supervision of normal first pregnancy, unspecified trimester: Secondary | ICD-10-CM

## 2021-04-20 DIAGNOSIS — O352XX0 Maternal care for (suspected) hereditary disease in fetus, not applicable or unspecified: Secondary | ICD-10-CM

## 2021-04-20 DIAGNOSIS — Z3A35 35 weeks gestation of pregnancy: Secondary | ICD-10-CM

## 2021-04-20 DIAGNOSIS — D563 Thalassemia minor: Secondary | ICD-10-CM

## 2021-04-20 NOTE — Progress Notes (Signed)
? ? ?  TELEHEALTH OBSTETRICS VISIT ENCOUNTER NOTE ? ?Provider location: [facility] [employer] at [facility]  ? ?Patient location: Home ? ?I connected with Kelly Meadows on 04/20/21 at  8:55 AM EDT by telephone at home and verified that I am speaking with the correct person using two identifiers. Of note, unable to do video encounter due to technical difficulties.  ?  ?I discussed the limitations, risks, security and privacy concerns of performing an evaluation and management service by telephone and the availability of in person appointments. I also discussed with the patient that there may be a patient responsible charge related to this service. The patient expressed understanding and agreed to proceed. ? ?Subjective:  ?Kelly Meadows is a 21 y.o. G1P0 at [redacted]w[redacted]d being followed for ongoing prenatal care.  She is currently monitored for the following issues for this low-risk pregnancy and has Supervision of normal first pregnancy, antepartum; Trichomonal vaginitis during pregnancy in first trimester; and Alpha thalassemia silent carrier on their problem list. ? ?Patient reports no complaints. Reports fetal movement. Denies any contractions, bleeding or leaking of fluid.  ? ?The following portions of the patient's history were reviewed and updated as appropriate: allergies, current medications, past family history, past medical history, past social history, past surgical history and problem list.  ? ?Objective:  ?Last menstrual period 07/18/2020. ?General:  Alert, oriented and cooperative.   ?Mental Status: Normal mood and affect perceived. Normal judgment and thought content.  ?Rest of physical exam deferred due to type of encounter ? ?Assessment and Plan:  ?Pregnancy: G1P0 at [redacted]w[redacted]d ?1. Supervision of normal first pregnancy, antepartum ?- Originally scheduled for MyChart but patient unable to get her phone speaker to work in video mode, so converted to [employer] ?- Desires waterbirth, unable to meet me  during previously scheduled office visits ?- Reviewed waterbirth policy, contraindications which may appear in late pregnancy ?- Patient can sign consent with Dr. Alvester Morin 04/17 ? ?2. Alpha thalassemia silent carrier ? ? ?3. [redacted] weeks gestation of pregnancy ?- 36 weeks swabs nv ? ?Preterm labor symptoms and general obstetric precautions including but not limited to vaginal bleeding, contractions, leaking of fluid and fetal movement were reviewed in detail with the patient.  ?I discussed the assessment and treatment plan with the patient. The patient was provided an opportunity to ask questions and all were answered. The patient agreed with the plan and demonstrated an understanding of the instructions. The patient was advised to call back or seek an in-person office evaluation/go to MAU at [facility] for any urgent or concerning symptoms. ?Please refer to After Visit Summary for other counseling recommendations.  ? ?I provided ten minutes of non-face-to-face time during this encounter. ? ? ?Future Appointments  ?Date Time Provider Department Center  ?04/27/2021  4:10 PM [place] Bing, MD [facility]  ?05/01/2021  3:30 PM Federico Flake, MD [facility]  ?05/08/2021  3:30 PM Reva Bores, MD [facility]  ?05/18/2021  3:50 PM Calvert Cantor, CNM [facility]  ?05/25/2021  3:50 PM Anyanwu, Jethro Bastos, MD [facility]  ? ? ?Calvert Cantor, CNM ?[facility] [employer], [facility] ? ? ? ?

## 2021-04-24 ENCOUNTER — Other Ambulatory Visit: Payer: Self-pay | Admitting: *Deleted

## 2021-04-24 ENCOUNTER — Encounter: Payer: BC Managed Care – PPO | Admitting: Family Medicine

## 2021-04-24 ENCOUNTER — Encounter: Payer: Self-pay | Admitting: Family Medicine

## 2021-04-24 MED ORDER — TERCONAZOLE 0.8 % VA CREA
1.0000 | TOPICAL_CREAM | Freq: Every day | VAGINAL | 0 refills | Status: DC
Start: 2021-04-24 — End: 2021-05-10

## 2021-04-27 ENCOUNTER — Ambulatory Visit (INDEPENDENT_AMBULATORY_CARE_PROVIDER_SITE_OTHER): Payer: BC Managed Care – PPO | Admitting: Obstetrics and Gynecology

## 2021-04-27 VITALS — BP 100/67 | HR 101 | Wt 193.0 lb

## 2021-04-27 DIAGNOSIS — A5901 Trichomonal vulvovaginitis: Secondary | ICD-10-CM

## 2021-04-27 DIAGNOSIS — Z3400 Encounter for supervision of normal first pregnancy, unspecified trimester: Secondary | ICD-10-CM

## 2021-04-27 DIAGNOSIS — Z3A36 36 weeks gestation of pregnancy: Secondary | ICD-10-CM

## 2021-04-27 DIAGNOSIS — O23593 Infection of other part of genital tract in pregnancy, third trimester: Secondary | ICD-10-CM

## 2021-04-27 MED ORDER — POLYETHYLENE GLYCOL 3350 17 G PO PACK
17.0000 g | PACK | Freq: Two times a day (BID) | ORAL | 1 refills | Status: DC
Start: 2021-04-27 — End: 2021-06-29

## 2021-04-28 NOTE — Progress Notes (Signed)
? ?  PRENATAL VISIT NOTE ? ?Subjective:  ?Kelly Meadows is a 21 y.o. G1P0 at [redacted]w[redacted]d being seen today for ongoing prenatal care.  She is currently monitored for the following issues for this low-risk pregnancy and has Supervision of normal first pregnancy, antepartum; Trichomonal vaginitis during pregnancy in first trimester; and Alpha thalassemia silent carrier on their problem list. ? ?Patient reports no complaints.  Contractions: Irritability. Vag. Bleeding: None.  Movement: Present. Denies leaking of fluid.  ? ?The following portions of the patient's history were reviewed and updated as appropriate: allergies, current medications, past family history, past medical history, past social history, past surgical history and problem list.  ? ?Objective:  ? ?Vitals:  ? 04/27/21 1614  ?BP: 100/67  ?Pulse: (!) 101  ?Weight: 193 lb (87.5 kg)  ? ? ?Fetal Status: Fetal Heart Rate (bpm): 141   Movement: Present    ? ?General:  Alert, oriented and cooperative. Patient is in no acute distress.  ?Skin: Skin is warm and dry. No rash noted.   ?Cardiovascular: Normal heart rate noted  ?Respiratory: Normal respiratory effort, no problems with respiration noted  ?Abdomen: Soft, gravid, appropriate for gestational age.  Pain/Pressure: Present     ?Pelvic: Cervical exam deferred        ?Extremities: Normal range of motion.  Edema: None  ?Mental Status: Normal mood and affect. Normal behavior. Normal judgment and thought content.  ? ?Assessment and Plan:  ?Pregnancy: G1P0 at [redacted]w[redacted]d ?1. [redacted] weeks gestation of pregnancy ?- Strep Gp B NAA ? ?2. Trichomonal vaginitis during pregnancy in first trimester ?Toc neg ? ? ?Preterm labor symptoms and general obstetric precautions including but not limited to vaginal bleeding, contractions, leaking of fluid and fetal movement were reviewed in detail with the patient. ?Please refer to After Visit Summary for other counseling recommendations.  ? ?No follow-ups on file. ? ?Future Appointments  ?Date  Time Provider Department Center  ?05/08/2021  3:30 PM Reva Bores, MD [facility]  ?05/18/2021  3:50 PM Calvert Cantor, CNM [facility]  ?05/25/2021  3:50 PM Anyanwu, Jethro Bastos, MD [facility]  ? ? ?[place] Bing, MD ? ?

## 2021-04-29 ENCOUNTER — Encounter: Payer: Self-pay | Admitting: Obstetrics and Gynecology

## 2021-04-29 DIAGNOSIS — O99820 Streptococcus B carrier state complicating pregnancy: Secondary | ICD-10-CM | POA: Insufficient documentation

## 2021-04-29 HISTORY — DX: Streptococcus B carrier state complicating pregnancy: O99.820

## 2021-04-29 LAB — STREP GP B NAA: Strep Gp B NAA: POSITIVE — AB

## 2021-05-01 ENCOUNTER — Encounter: Payer: BC Managed Care – PPO | Admitting: Family Medicine

## 2021-05-02 ENCOUNTER — Encounter: Payer: Self-pay | Admitting: Obstetrics and Gynecology

## 2021-05-04 ENCOUNTER — Encounter: Payer: BC Managed Care – PPO | Admitting: Advanced Practice Midwife

## 2021-05-08 ENCOUNTER — Ambulatory Visit (INDEPENDENT_AMBULATORY_CARE_PROVIDER_SITE_OTHER): Payer: BC Managed Care – PPO | Admitting: Family Medicine

## 2021-05-08 ENCOUNTER — Other Ambulatory Visit (HOSPITAL_COMMUNITY)
Admission: RE | Admit: 2021-05-08 | Discharge: 2021-05-08 | Disposition: A | Discharge status: Home / Self Care | Payer: BC Managed Care – PPO | Source: Ambulatory Visit | Attending: Family Medicine | Admitting: Family Medicine

## 2021-05-08 VITALS — BP 107/70 | HR 96

## 2021-05-08 DIAGNOSIS — B379 Candidiasis, unspecified: Secondary | ICD-10-CM | POA: Insufficient documentation

## 2021-05-08 DIAGNOSIS — Z3400 Encounter for supervision of normal first pregnancy, unspecified trimester: Secondary | ICD-10-CM

## 2021-05-08 DIAGNOSIS — O99820 Streptococcus B carrier state complicating pregnancy: Secondary | ICD-10-CM

## 2021-05-08 NOTE — Progress Notes (Signed)
? ?  PRENATAL VISIT NOTE ? ?Subjective:  ?Kelly Meadows is a 21 y.o. G1P0 at [redacted]w[redacted]d being seen today for ongoing prenatal care.  She is currently monitored for the following issues for this low-risk pregnancy and has Supervision of normal first pregnancy, antepartum; Trichomonal vaginitis during pregnancy in first trimester; Alpha thalassemia silent carrier; and GBS (group B Streptococcus carrier), +RV culture, currently pregnant on their problem list. ? ?Patient reports occasional contractions, vaginal irritation, and treated for yeast on multiple occasions .  Contractions: Irritability. Vag. Bleeding: None.  Movement: Present. Denies leaking of fluid.  ? ?The following portions of the patient's history were reviewed and updated as appropriate: allergies, current medications, past family history, past medical history, past social history, past surgical history and problem list.  ? ?Objective:  ? ?Vitals:  ? 05/08/21 0350  ?BP: 107/70  ?Pulse: 96  ? ? ?Fetal Status: Fetal Heart Rate (bpm): 137 Fundal Height: 35 cm Movement: Present  Presentation: Vertex ? ?General:  Alert, oriented and cooperative. Patient is in no acute distress.  ?Skin: Skin is warm and dry. No rash noted.   ?Cardiovascular: Normal heart rate noted  ?Respiratory: Normal respiratory effort, no problems with respiration noted  ?Abdomen: Soft, gravid, appropriate for gestational age.  Pain/Pressure: Present     ?Pelvic: Cervical exam performed in the presence of a chaperone Dilation: Fingertip Effacement (%): 80, 90 Station: -3, -2  ?Extremities: Normal range of motion.  Edema: Trace  ?Mental Status: Normal mood and affect. Normal behavior. Normal judgment and thought content.  ? ?Assessment and Plan:  ?Pregnancy: G1P0 at [redacted]w[redacted]d ?1. Yeast infection ?Check cultures and treat accordingly ?- Cervicovaginal ancillary only ? ?2. GBS (group B Streptococcus carrier), +RV culture, currently pregnant ?Will need treatment in labor ? ?3. Supervision of normal  first pregnancy, antepartum ? ? ?Term labor symptoms and general obstetric precautions including but not limited to vaginal bleeding, contractions, leaking of fluid and fetal movement were reviewed in detail with the patient. ?Please refer to After Visit Summary for other counseling recommendations.  ? ?No follow-ups on file. ? ?Future Appointments  ?Date Time Provider Department Center  ?05/18/2021  3:50 PM Calvert Cantor, CNM [facility]  ?05/25/2021  3:50 PM Anyanwu, Jethro Bastos, MD [facility]  ? ? ?Reva Bores, MD ? ?

## 2021-05-08 NOTE — Progress Notes (Signed)
ROB 38.[redacted] wks GA ?Reports continued vaginal discharge: white, "sometimes green", used RX yeast 7 day, then 3 day. Wants swab. ?Request SVE ?

## 2021-05-10 ENCOUNTER — Encounter: Payer: Self-pay | Admitting: Family Medicine

## 2021-05-10 LAB — CERVICOVAGINAL ANCILLARY ONLY
Bacterial Vaginitis (gardnerella): POSITIVE — AB
Candida Glabrata: NEGATIVE
Candida Vaginitis: POSITIVE — AB
Comment: NEGATIVE
Comment: NEGATIVE
Comment: NEGATIVE

## 2021-05-10 MED ORDER — TERCONAZOLE 0.8 % VA CREA: 1.0000 | TOPICAL_CREAM | Freq: Every day | VAGINAL | 0 refills | Status: DC

## 2021-05-10 MED ORDER — METRONIDAZOLE 500 MG PO TABS: 500.0000 mg | ORAL_TABLET | Freq: Two times a day (BID) | ORAL | 0 refills | Status: DC

## 2021-05-10 NOTE — Addendum Note (Signed)
Addended by: Reva Bores on: 05/10/2021 12:58 PM ? ? Modules accepted: Orders ? ?

## 2021-05-15 DIAGNOSIS — Z419 Encounter for procedure for purposes other than remedying health state, unspecified: Secondary | ICD-10-CM | POA: Diagnosis not present

## 2021-05-17 ENCOUNTER — Inpatient Hospital Stay (HOSPITAL_COMMUNITY)
Admission: AD | Admit: 2021-05-17 | Discharge: 2021-05-20 | DRG: 806 | Disposition: A | Discharge status: Home / Self Care | Payer: BC Managed Care – PPO | Attending: Obstetrics & Gynecology | Admitting: Obstetrics & Gynecology

## 2021-05-17 ENCOUNTER — Other Ambulatory Visit: Payer: Self-pay

## 2021-05-17 ENCOUNTER — Encounter (HOSPITAL_COMMUNITY): Payer: Self-pay | Admitting: Obstetrics & Gynecology

## 2021-05-17 DIAGNOSIS — O9882 Other maternal infectious and parasitic diseases complicating childbirth: Secondary | ICD-10-CM | POA: Diagnosis present

## 2021-05-17 DIAGNOSIS — Z3400 Encounter for supervision of normal first pregnancy, unspecified trimester: Secondary | ICD-10-CM

## 2021-05-17 DIAGNOSIS — D563 Thalassemia minor: Secondary | ICD-10-CM | POA: Diagnosis present

## 2021-05-17 DIAGNOSIS — Z3A39 39 weeks gestation of pregnancy: Secondary | ICD-10-CM

## 2021-05-17 DIAGNOSIS — O99824 Streptococcus B carrier state complicating childbirth: Secondary | ICD-10-CM | POA: Diagnosis present

## 2021-05-17 DIAGNOSIS — O322XX0 Maternal care for transverse and oblique lie, not applicable or unspecified: Secondary | ICD-10-CM | POA: Diagnosis present

## 2021-05-17 DIAGNOSIS — B3731 Acute candidiasis of vulva and vagina: Secondary | ICD-10-CM | POA: Diagnosis present

## 2021-05-17 DIAGNOSIS — O99820 Streptococcus B carrier state complicating pregnancy: Secondary | ICD-10-CM

## 2021-05-17 DIAGNOSIS — A5901 Trichomonal vulvovaginitis: Secondary | ICD-10-CM | POA: Diagnosis present

## 2021-05-17 DIAGNOSIS — O26893 Other specified pregnancy related conditions, third trimester: Secondary | ICD-10-CM | POA: Diagnosis present

## 2021-05-17 DIAGNOSIS — O326XX0 Maternal care for compound presentation, not applicable or unspecified: Secondary | ICD-10-CM | POA: Diagnosis not present

## 2021-05-17 LAB — WET PREP, GENITAL
Sperm: NONE SEEN
Trich, Wet Prep: NONE SEEN
WBC, Wet Prep HPF POC: >=10 — AB (ref <10)

## 2021-05-17 MED ORDER — OXYCODONE-ACETAMINOPHEN 5-325 MG PO TABS
1.0000 | ORAL_TABLET | Freq: Four times a day (QID) | ORAL | Status: DC | PRN
  Filled 2021-05-17: qty 1

## 2021-05-17 MED ORDER — ACETAMINOPHEN 325 MG PO TABS
650.0000 mg | ORAL_TABLET | Freq: Once | ORAL | Status: AC
  Administered 2021-05-17: 650 mg via ORAL
  Filled 2021-05-17: qty 2

## 2021-05-17 MED ORDER — FLUCONAZOLE 150 MG PO TABS: 150.0000 mg | ORAL_TABLET | Freq: Once | ORAL | Status: DC

## 2021-05-17 NOTE — MAU Note (Signed)
..  Kelly Meadows is a 21 y.o. at [redacted]w[redacted]d here in MAU reporting: contractions began around 5pm and they are now back to back. Denies vaginal bleeding or leaking of fluid. +FM  ? ?Onset of complaint: today ?Pain score: 8/10 ?Vitals:  ? 05/17/21 2042  ?BP: 136/76  ?Pulse: 85  ?Resp: 18  ?Temp: 98.3 ?F (36.8 ?C)  ?SpO2: 97%  ?   ? ? ?

## 2021-05-17 NOTE — MAU Provider Note (Addendum)
Chief Complaint:  Contractions ? ? Event Date/Time  ? First Provider Initiated Contact with Patient 05/17/21 2152   ?  ?HPI ? ?HPI: Kelly Meadows is a 21 y.o. G1P0 at 37w3dwho presents to maternity admissions reporting painful contractions.  Also has itching for known yeast vaginitis, did not take Terazol "because it didn't work last time".Marland Kitchen ?She reports good fetal movement, denies LOF, vaginal bleeding, urinary symptoms, h/a, dizziness, n/v, diarrhea, constipation or fever/chills.   ? ?Past Medical History: ?Past Medical History:  ?Diagnosis Date  ? Medical history non-contributory   ? ? ?Past obstetric history: ?OB History  ?Gravida Para Term Preterm AB Living  ?1            ?SAB IAB Ectopic Multiple Live Births  ?           ?  ?# Outcome Date GA Lbr Len/2nd Weight Sex Delivery Anes PTL Lv  ?1 Current           ? ? ?Past Surgical History: ?Past Surgical History:  ?Procedure Laterality Date  ? NO PAST SURGERIES    ? ? ?Family History: ?Family History  ?Problem Relation Age of Onset  ? Breast cancer Mother   ? Vision loss Father   ? ? ?Social History: ?Social History  ? ?Tobacco Use  ? Smoking status: Never  ? Smokeless tobacco: Never  ?Vaping Use  ? Vaping Use: Former  ? Start date: 08/23/2020  ?Substance Use Topics  ? Alcohol use: Not Currently  ?  Alcohol/week: 1.0 standard drink  ?  Types: 1 Standard drinks or equivalent per week  ? Drug use: Not Currently  ?  Types: Marijuana  ?  Comment: Stopped approx 1-2 months ago (03/31/20)  ? ? ?Allergies: No Known Allergies ? ?Meds:  ?Medications Prior to Admission  ?Medication Sig Dispense Refill Last Dose  ? Prenatal Vit-Fe Fumarate-FA (MULTIVITAMIN-PRENATAL) 27-0.8 MG TABS tablet Take 1 tablet by mouth daily at 12 noon.   05/17/2021  ? hydrocortisone (ANUSOL-HC) 25 MG suppository Place 1 suppository (25 mg total) rectally 2 (two) times daily. 12 suppository 1   ? metroNIDAZOLE (FLAGYL) 500 MG tablet Take 1 tablet (500 mg total) by mouth 2 (two) times daily. 14 tablet 0    ? polyethylene glycol (MIRALAX / GLYCOLAX) 17 g packet Take 17 g by mouth 2 (two) times daily. 40 each 1   ? terconazole (TERAZOL 3) 0.8 % vaginal cream Place 1 applicator vaginally at bedtime. 20 g 0   ? ? ?I have reviewed patient's Past Medical Hx, Surgical Hx, Family Hx, Social Hx, medications and allergies.  ? ?ROS:  ?Review of Systems ?Other systems negative ? ?Physical Exam  ?Patient Vitals for the past 24 hrs: ? BP Temp Temp src Pulse Resp SpO2 Height Weight  ?05/17/21 2248 (!) 101/58 -- -- 78 -- -- -- --  ?05/17/21 2042 136/76 98.3 ?F (36.8 ?C) Oral 85 18 97 % 5\' 5"  (1.651 m) 202 lb 9.6 oz (91.9 kg)  ? ?Constitutional: Well-developed, well-nourished female, breathing through contractions  ?Cardiovascular: normal rate, warm and well perfused  ?Respiratory: normal effort on room air  ?GI: soft, nontender, gravid  ?MS: no LE edema, normal ROM ?Neurologic: Alert and oriented x 4 ? ?Dilation: 4 ?Effacement (%): 90 ?Station: -1 ?Presentation: Vertex ?Exam by:: [email or web address], MD ? ?FHT:  Baseline 145 bpm, moderate variability, accelerations present, no decelerations ?Contractions:  Irregular  ?  ?Labs: ?Results for orders placed or performed during the hospital encounter of 05/17/21 (from  the past 24 hour(s))  ?Wet prep, genital     Status: Abnormal  ? Collection Time: 05/17/21  8:59 PM  ? Specimen: Vaginal  ?Result Value Ref Range  ? Yeast Wet Prep HPF POC PRESENT (A) NONE SEEN  ? Trich, Wet Prep NONE SEEN NONE SEEN  ? Clue Cells Wet Prep HPF POC PRESENT (A) NONE SEEN  ? WBC, Wet Prep HPF POC >=10 (A) <10  ? Sperm NONE SEEN   ? ?A/Positive/-- (10/20 1016) ? ?Imaging:  ?No results found. ? ?MAU Course/MDM: ?I have ordered labs and reviewed results.  ?NST reviewed. ? ?Treatments in MAU included EFM. ? ?Offered Diflucan, refuses due to "dangerous to baby"   Explained category C, risk vs benefit, no absolute need to treat yeast.  Declines for now. ? ?Offered Percocet for pain per pt request, Mother of pt worried about  effects on baby, discussed cat C, risks/benefits.  They want to try a heating pad.  ? ?Asked about intent for waterbirth, pt states "I don't know, I don't think I can take it".   ? ?Offered one more hour with recheck and care turned over to Dr Mathis Fare. ? ?Wynelle Bourgeois CNM, MSN ?Certified Nurse-Midwife ? ?Upon my reassessment, SVE 4/90/-1. Contracting regular every 2-3 minutes by palpation.  ? ?Assessment: ?Single IUP at [redacted]w[redacted]d ?Normal labor ? ?Plan: ?Admit to L&D ?Plan to treat yeast and BV postpartum  ?Orders placed ? ?05/18/2021 ?12:05 AM ?

## 2021-05-18 ENCOUNTER — Encounter (HOSPITAL_COMMUNITY): Payer: Self-pay | Admitting: Obstetrics & Gynecology

## 2021-05-18 ENCOUNTER — Inpatient Hospital Stay (HOSPITAL_COMMUNITY): Payer: BC Managed Care – PPO | Admitting: Anesthesiology

## 2021-05-18 ENCOUNTER — Encounter: Payer: BC Managed Care – PPO | Admitting: Advanced Practice Midwife

## 2021-05-18 DIAGNOSIS — O326XX0 Maternal care for compound presentation, not applicable or unspecified: Secondary | ICD-10-CM | POA: Diagnosis not present

## 2021-05-18 DIAGNOSIS — Z3A39 39 weeks gestation of pregnancy: Secondary | ICD-10-CM

## 2021-05-18 DIAGNOSIS — O322XX0 Maternal care for transverse and oblique lie, not applicable or unspecified: Secondary | ICD-10-CM | POA: Diagnosis not present

## 2021-05-18 DIAGNOSIS — O99820 Streptococcus B carrier state complicating pregnancy: Secondary | ICD-10-CM | POA: Diagnosis not present

## 2021-05-18 LAB — RPR: RPR Ser Ql: NONREACTIVE

## 2021-05-18 LAB — TYPE AND SCREEN
ABO/RH(D): A POS
Antibody Screen: NEGATIVE

## 2021-05-18 LAB — CBC
HCT: 38.5 % (ref 36.0–46.0)
Hemoglobin: 13 g/dL (ref 12.0–15.0)
MCH: 28.6 pg (ref 26.0–34.0)
MCHC: 33.8 g/dL (ref 30.0–36.0)
MCV: 84.6 fL (ref 80.0–100.0)
Platelets: 175 10*3/uL (ref 150–400)
RBC: 4.55 MIL/uL (ref 3.87–5.11)
RDW: 13.7 % (ref 11.5–15.5)
WBC: 13.2 10*3/uL — ABNORMAL HIGH (ref 4.0–10.5)
nRBC: 0 % (ref 0.0–0.2)

## 2021-05-18 MED ORDER — SOD CITRATE-CITRIC ACID 500-334 MG/5ML PO SOLN: 30.0000 mL | ORAL | Status: DC | PRN

## 2021-05-18 MED ORDER — LACTATED RINGERS IV SOLN: INTRAVENOUS | Status: DC

## 2021-05-18 MED ORDER — EPHEDRINE 5 MG/ML INJ: 10.0000 mg | INTRAVENOUS | Status: DC | PRN

## 2021-05-18 MED ORDER — SODIUM CHLORIDE 0.9 % IV SOLN
5.0000 10*6.[IU] | Freq: Once | INTRAVENOUS | Status: AC
  Administered 2021-05-18: 5 10*6.[IU] via INTRAVENOUS
  Filled 2021-05-18: qty 5

## 2021-05-18 MED ORDER — ONDANSETRON HCL 4 MG PO TABS: 4.0000 mg | ORAL_TABLET | ORAL | Status: DC | PRN

## 2021-05-18 MED ORDER — ONDANSETRON HCL 4 MG/2ML IJ SOLN: 4.0000 mg | Freq: Four times a day (QID) | INTRAMUSCULAR | Status: DC | PRN

## 2021-05-18 MED ORDER — OXYCODONE-ACETAMINOPHEN 5-325 MG PO TABS: 1.0000 | ORAL_TABLET | ORAL | Status: DC | PRN

## 2021-05-18 MED ORDER — LIDOCAINE HCL (PF) 1 % IJ SOLN: 30.0000 mL | INTRAMUSCULAR | Status: DC | PRN

## 2021-05-18 MED ORDER — PRENATAL MULTIVITAMIN CH
1.0000 | ORAL_TABLET | Freq: Every day | ORAL | Status: DC
  Administered 2021-05-18 – 2021-05-20 (×3): 1 via ORAL
  Filled 2021-05-18 (×3): qty 1

## 2021-05-18 MED ORDER — FENTANYL CITRATE (PF) 100 MCG/2ML IJ SOLN
50.0000 ug | INTRAMUSCULAR | Status: DC | PRN
  Administered 2021-05-18: 100 ug via INTRAVENOUS
  Filled 2021-05-18: qty 2

## 2021-05-18 MED ORDER — ONDANSETRON HCL 4 MG/2ML IJ SOLN: 4.0000 mg | INTRAMUSCULAR | Status: DC | PRN

## 2021-05-18 MED ORDER — COCONUT OIL OIL
1.0000 "application " | TOPICAL_OIL | Status: DC | PRN
  Administered 2021-05-18: 1 via TOPICAL

## 2021-05-18 MED ORDER — WITCH HAZEL-GLYCERIN EX PADS
1.0000 "application " | MEDICATED_PAD | CUTANEOUS | Status: DC | PRN
  Administered 2021-05-20: 1 via TOPICAL

## 2021-05-18 MED ORDER — PHENYLEPHRINE 80 MCG/ML (10ML) SYRINGE FOR IV PUSH (FOR BLOOD PRESSURE SUPPORT): 80.0000 ug | PREFILLED_SYRINGE | INTRAVENOUS | Status: DC | PRN

## 2021-05-18 MED ORDER — ACETAMINOPHEN 325 MG PO TABS: 650.0000 mg | ORAL_TABLET | ORAL | Status: DC | PRN

## 2021-05-18 MED ORDER — IBUPROFEN 600 MG PO TABS
600.0000 mg | ORAL_TABLET | Freq: Four times a day (QID) | ORAL | Status: DC
  Administered 2021-05-18 – 2021-05-20 (×8): 600 mg via ORAL
  Filled 2021-05-18 (×10): qty 1

## 2021-05-18 MED ORDER — LIDOCAINE-EPINEPHRINE (PF) 2 %-1:200000 IJ SOLN
INTRAMUSCULAR | Status: DC | PRN
  Administered 2021-05-18: 5 mL via EPIDURAL

## 2021-05-18 MED ORDER — LACTATED RINGERS IV SOLN: 500.0000 mL | INTRAVENOUS | Status: DC | PRN

## 2021-05-18 MED ORDER — ACETAMINOPHEN 325 MG PO TABS
650.0000 mg | ORAL_TABLET | ORAL | Status: DC | PRN
  Administered 2021-05-18 – 2021-05-19 (×2): 650 mg via ORAL
  Filled 2021-05-18 (×2): qty 2

## 2021-05-18 MED ORDER — FENTANYL-BUPIVACAINE-NACL 0.5-0.125-0.9 MG/250ML-% EP SOLN
12.0000 mL/h | EPIDURAL | Status: DC | PRN
  Administered 2021-05-18: 12 mL/h via EPIDURAL
  Filled 2021-05-18: qty 250

## 2021-05-18 MED ORDER — OXYCODONE-ACETAMINOPHEN 5-325 MG PO TABS: 2.0000 | ORAL_TABLET | ORAL | Status: DC | PRN

## 2021-05-18 MED ORDER — LACTATED RINGERS IV SOLN
500.0000 mL | Freq: Once | INTRAVENOUS | Status: AC
  Administered 2021-05-18: 500 mL via INTRAVENOUS

## 2021-05-18 MED ORDER — TRANEXAMIC ACID-NACL 1000-0.7 MG/100ML-% IV SOLN
INTRAVENOUS | Status: AC
  Filled 2021-05-18: qty 100

## 2021-05-18 MED ORDER — PHENYLEPHRINE 80 MCG/ML (10ML) SYRINGE FOR IV PUSH (FOR BLOOD PRESSURE SUPPORT)
80.0000 ug | PREFILLED_SYRINGE | INTRAVENOUS | Status: DC | PRN
  Filled 2021-05-18: qty 10

## 2021-05-18 MED ORDER — OXYTOCIN-SODIUM CHLORIDE 30-0.9 UT/500ML-% IV SOLN
2.5000 [IU]/h | INTRAVENOUS | Status: DC
  Administered 2021-05-18: 2.5 [IU]/h via INTRAVENOUS
  Filled 2021-05-18: qty 500

## 2021-05-18 MED ORDER — DIBUCAINE (PERIANAL) 1 % EX OINT
1.0000 "application " | TOPICAL_OINTMENT | CUTANEOUS | Status: DC | PRN
  Administered 2021-05-20: 1 via RECTAL
  Filled 2021-05-18: qty 28

## 2021-05-18 MED ORDER — BENZOCAINE-MENTHOL 20-0.5 % EX AERO
1.0000 "application " | INHALATION_SPRAY | CUTANEOUS | Status: DC | PRN
  Administered 2021-05-18: 1 via TOPICAL
  Filled 2021-05-18: qty 56

## 2021-05-18 MED ORDER — SIMETHICONE 80 MG PO CHEW: 80.0000 mg | CHEWABLE_TABLET | ORAL | Status: DC | PRN

## 2021-05-18 MED ORDER — SENNOSIDES-DOCUSATE SODIUM 8.6-50 MG PO TABS
2.0000 | ORAL_TABLET | Freq: Every day | ORAL | Status: DC
  Administered 2021-05-19 – 2021-05-20 (×2): 2 via ORAL
  Filled 2021-05-18 (×2): qty 2

## 2021-05-18 MED ORDER — DIPHENHYDRAMINE HCL 50 MG/ML IJ SOLN: 12.5000 mg | INTRAMUSCULAR | Status: DC | PRN

## 2021-05-18 MED ORDER — PENICILLIN G POT IN DEXTROSE 60000 UNIT/ML IV SOLN: 3.0000 10*6.[IU] | INTRAVENOUS | Status: DC

## 2021-05-18 MED ORDER — DIPHENHYDRAMINE HCL 25 MG PO CAPS: 25.0000 mg | ORAL_CAPSULE | Freq: Four times a day (QID) | ORAL | Status: DC | PRN

## 2021-05-18 MED ORDER — OXYTOCIN BOLUS FROM INFUSION
333.0000 mL | Freq: Once | INTRAVENOUS | Status: AC
  Administered 2021-05-18: 333 mL via INTRAVENOUS

## 2021-05-18 MED ORDER — TRANEXAMIC ACID-NACL 1000-0.7 MG/100ML-% IV SOLN
1000.0000 mg | Freq: Once | INTRAVENOUS | Status: AC
  Administered 2021-05-18: 1000 mg via INTRAVENOUS

## 2021-05-18 NOTE — Lactation Note (Signed)
This note was copied from a baby's chart. ?Lactation Consultation Note ? ?Patient Name: Kelly Meadows ?Today's Date: 05/18/2021 ? 1st baby 1st time BF.  ?Age:23 hours ?Per mom the baby has been on and off feeding. Baby cuing. LC offered to assist to latch and mom receptive.  ?LC 1st reviewed hand expressing. And then assisted to latch on the left breast / per mom some pinching. LC eased chin down, flipped upper lip, pinching improved.  ?Latch of 7 .  ?LC noted areola edema , - LC showed mom the reverse pressure exercise.  ? ?Maternal Data ?  ? ?Feeding ?  ? ?LATCH Score ?Latch: Repeated attempts needed to sustain latch, nipple held in mouth throughout feeding, stimulation needed to elicit sucking reflex. ? ?Audible Swallowing: A few with stimulation ? ?Type of Nipple: Everted at rest and after stimulation ? ?Comfort (Breast/Nipple): Soft / non-tender ? ?Hold (Positioning): Assistance needed to correctly position infant at breast and maintain latch. ? ?LATCH Score: 7 ? ? ?Lactation Tools Discussed/Used ?  ? ?Interventions ?Interventions: Hand express;Skin to skin;Assisted with latch;Breast massage;Support pillows;Position options ? ?Discharge ?Pump: DEBP ?WIC Program: Yes ? ?Consult Status ?Consult Status: Follow-up ?Date: 05/18/21 ?Follow-up type: In-patient ? ? ? ?Matilde Sprang Aleatha Taite ?05/18/2021, 8:42 AM ? ? ? ?

## 2021-05-18 NOTE — Lactation Note (Signed)
This note was copied from a baby's chart. ?Lactation Consultation Note ?Mom wearing shells in bra. Praised mom. Encouraged to not to sleep in shells at night. ?Hand pump given for pre-pumping. ?Coconut oil given for soreness. Nipples intact. Slightly red. Mom stated they hurt.  ?Encouraged football hold.newborn feeding habits reviewed. ?LC worries w/mom's nipples all ready being sore and cluster feeding hasn't started yet how mom is going to take it. She is doing well and being strong at this time. ?Mom is very short shafted and compressible. ?If pain gets worse she may need NS. ?Praised mom for her hard work. ?Call if needs assistance. ? ?Patient Name: Kelly Meadows ?Today's Date: 05/18/2021 ?Reason for consult: Follow-up assessment;Primapara;Term ?Age:79 hours ? ?Maternal Data ?Does the patient have breastfeeding experience prior to this delivery?: No ? ?Feeding ?  ? ?LATCH Score ?Latch: Grasps breast easily, tongue down, lips flanged, rhythmical sucking. ? ?Audible Swallowing: None ? ?Type of Nipple: Everted at rest and after stimulation (very short shaft/compressible) ? ?Comfort (Breast/Nipple): Filling, red/small blisters or bruises, mild/mod discomfort (very sore/intact) ? ?Hold (Positioning): Assistance needed to correctly position infant at breast and maintain latch. ? ?LATCH Score: 6 ? ? ?Lactation Tools Discussed/Used ?Tools: Shells;Pump;Coconut oil ?Breast pump type: Manual ? ?Interventions ?Interventions: Breast feeding basics reviewed;Assisted with latch;Skin to skin;Breast massage;Breast compression;Adjust position;Position options;Support pillows;Shells;Coconut oil;Hand pump ? ?Discharge ?  ? ?Consult Status ?Consult Status: Follow-up ?Date: 05/19/21 ?Follow-up type: In-patient ? ? ? ?Charyl Dancer ?05/18/2021, 10:13 PM ? ? ? ?

## 2021-05-18 NOTE — Anesthesia Postprocedure Evaluation (Signed)
Anesthesia Post Note ? ?Patient: Kelly Meadows ? ?Procedure(s) Performed: AN AD HOC LABOR EPIDURAL ? ?  ? ?Patient location during evaluation: Mother Baby ?Anesthesia Type: Epidural ?Level of consciousness: awake, awake and alert and oriented ?Pain management: pain level controlled ?Vital Signs Assessment: post-procedure vital signs reviewed and stable ?Respiratory status: spontaneous breathing and respiratory function stable ?Cardiovascular status: blood pressure returned to baseline ?Postop Assessment: no headache, epidural receding, patient able to bend at knees, adequate PO intake, no backache, no apparent nausea or vomiting and able to ambulate ?Anesthetic complications: no ? ? ?No notable events documented. ? ?Last Vitals:  ?Vitals:  ? 05/18/21 0545 05/18/21 1050  ?BP: 119/70 105/65  ?Pulse: 93 98  ?Resp: 18 18  ?Temp: 36.7 ?C 36.7 ?C  ?SpO2: 100% 97%  ?  ?Last Pain:  ?Vitals:  ? 05/18/21 1359  ?TempSrc:   ?PainSc: 6   ? ?Pain Goal: Patients Stated Pain Goal: 0 (05/17/21 2039) ? ?  ?  ?  ?  ?  ?  ?  ? ?Evelina Dun R ? ? ? ? ?

## 2021-05-18 NOTE — Anesthesia Procedure Notes (Signed)
Epidural ?Patient location during procedure: OB ?Start time: 05/18/2021 1:00 AM ?End time: 05/18/2021 1:10 AM ? ?Staffing ?Anesthesiologist: Elmer Picker, MD ?Performed: anesthesiologist  ? ?Preanesthetic Checklist ?Completed: patient identified, IV checked, risks and benefits discussed, monitors and equipment checked, pre-op evaluation and timeout performed ? ?Epidural ?Patient position: sitting ?Prep: DuraPrep and site prepped and draped ?Patient monitoring: continuous pulse ox, blood pressure, heart rate and cardiac monitor ?Approach: midline ?Location: L3-L4 ?Injection technique: LOR air ? ?Needle:  ?Needle type: Tuohy  ?Needle gauge: 17 G ?Needle length: 9 cm ?Needle insertion depth: 6 cm ?Catheter type: closed end flexible ?Catheter size: 19 Gauge ?Catheter at skin depth: 11 cm ?Test dose: negative ? ?Assessment ?Sensory level: T8 ?Events: blood not aspirated, injection not painful, no injection resistance, no paresthesia and negative IV test ? ?Additional Notes ?Patient identified. Risks/Benefits/Options discussed with patient including but not limited to bleeding, infection, nerve damage, paralysis, failed block, incomplete pain control, headache, blood pressure changes, nausea, vomiting, reactions to medication both or allergic, itching and postpartum back pain. Confirmed with bedside nurse the patient's most recent platelet count. Confirmed with patient that they are not currently taking any anticoagulation, have any bleeding history or any family history of bleeding disorders. Patient expressed understanding and wished to proceed. All questions were answered. Sterile technique was used throughout the entire procedure. Please see nursing notes for vital signs. Test dose was given through epidural catheter and negative prior to continuing to dose epidural or start infusion. Warning signs of high block given to the patient including shortness of breath, tingling/numbness in hands, complete motor block, or  any concerning symptoms with instructions to call for help. Patient was given instructions on fall risk and not to get out of bed. All questions and concerns addressed with instructions to call with any issues or inadequate analgesia.  Reason for block:procedure for pain ? ? ? ?

## 2021-05-18 NOTE — H&P (Signed)
OBSTETRIC ADMISSION HISTORY AND PHYSICAL ? ?Kelly Meadows is a 21 y.o. female G1P0 with IUP at [redacted]w[redacted]d by 9 week [place] presenting for SOL. She reports +FMs, no LOF, no VB, no blurry vision, headaches, peripheral edema, or RUQ pain.  She plans on breast feeding. She is unsure about what she would like to use for birth control postpartum.  ? ?She received her prenatal care at [facility].  ? ?Dating: By [place] --->  Estimated Date of Delivery: 05/21/21 ? ?Sono:   ?@[redacted]w[redacted]d , normal anatomy, cephalic presentation, anterior placental lie, 535 g, 62% EFW ? ?Prenatal History/Complications:  ?Trichomonas in pregnancy (TOC negative) ?Alpha thalassemia silent carrier ?GBS positive  ? ?Past Medical History: ?Past Medical History:  ?Diagnosis Date  ? Medical history non-contributory   ? ? ?Past Surgical History: ?Past Surgical History:  ?Procedure Laterality Date  ? NO PAST SURGERIES    ? ? ?Obstetrical History: ?OB History   ? ? Gravida  ?1  ? Para  ?   ? Term  ?   ? Preterm  ?   ? AB  ?   ? Living  ?   ?  ? ? SAB  ?   ? IAB  ?   ? Ectopic  ?   ? Multiple  ?   ? Live Births  ?   ?   ?  ?  ? ? ?Social History ?Social History  ? ?Socioeconomic History  ? Marital status: Single  ?  Spouse name: Not on file  ? Number of children: Not on file  ? Years of education: Not on file  ? Highest education level: Not on file  ?Occupational History  ? Not on file  ?Tobacco Use  ? Smoking status: Never  ? Smokeless tobacco: Never  ?Vaping Use  ? Vaping Use: Former  ? Start date: 08/23/2020  ?Substance and Sexual Activity  ? Alcohol use: Not Currently  ?  Alcohol/week: 1.0 standard drink  ?  Types: 1 Standard drinks or equivalent per week  ? Drug use: Not Currently  ?  Types: Marijuana  ?  Comment: Stopped approx 1-2 months ago (03/31/20)  ? Sexual activity: Yes  ?  Birth control/protection: None  ?Other Topics Concern  ? Not on file  ?Social History Narrative  ? Not on file  ? ?Social Determinants of Health  ? ?Financial Resource Strain: Not on file   ?Food Insecurity: Not on file  ?Transportation Needs: Not on file  ?Physical Activity: Not on file  ?Stress: Not on file  ?Social Connections: Not on file  ? ? ?Family History: ?Family History  ?Problem Relation Age of Onset  ? Breast cancer Mother   ? Vision loss Father   ? ? ?Allergies: ?No Known Allergies ? ?Medications Prior to Admission  ?Medication Sig Dispense Refill Last Dose  ? Prenatal Vit-Fe Fumarate-FA (MULTIVITAMIN-PRENATAL) 27-0.8 MG TABS tablet Take 1 tablet by mouth daily at 12 noon.   05/17/2021  ? hydrocortisone (ANUSOL-HC) 25 MG suppository Place 1 suppository (25 mg total) rectally 2 (two) times daily. 12 suppository 1   ? metroNIDAZOLE (FLAGYL) 500 MG tablet Take 1 tablet (500 mg total) by mouth 2 (two) times daily. 14 tablet 0   ? polyethylene glycol (MIRALAX / GLYCOLAX) 17 g packet Take 17 g by mouth 2 (two) times daily. 40 each 1   ? terconazole (TERAZOL 3) 0.8 % vaginal cream Place 1 applicator vaginally at bedtime. 20 g 0   ? ? ? ?Review  of Systems  ?All systems reviewed and negative except as stated in HPI ? ?Blood pressure (!) 101/58, pulse 78, temperature 98.3 ?F (36.8 ?C), temperature source Oral, resp. rate 18, height 5\' 5"  (1.651 m), weight 91.9 kg, last menstrual period 07/18/2020, SpO2 97 %. ? ?General appearance: alert, cooperative, and no distress, breathing through contractions  ?Lungs: normal work of breathing on room air  ?Heart: normal rate, warm and well perfused  ?Abdomen: soft, non-tender, gravid  ?Extremities: no LE edema or calf tenderness to palpation  ? ?Presentation:  Cephalic ?Fetal monitoring: Baseline 145 bpm, moderate variability, + accels, no decels  ?Uterine activity: Every 2-3 minutes  ?Dilation: 4 ?Effacement (%): 90 ?Station: -1 ?Exam by:: [email or web address], MD ? ?Prenatal labs: ?ABO, Rh: A/Positive/-- (10/20 1016) ?Antibody: Negative (10/20 1016) ?Rubella: 6.56 (10/20 1016) ?RPR: Non Reactive (02/15 0839)  ?HBsAg: Negative (10/20 1016)  ?HIV: Non Reactive (02/15 0839)   ?GBS: Positive/-- (04/13 1626)  ?2 hr Glucola - Normal  ?Genetic screening - LR NIPS, AFP negative, silent carrier for alpha thalassemia  ?Anatomy 04-24-1984 normal  ? ?Prenatal Transfer Tool  ?Maternal Diabetes: No ?Genetic Screening: LR NIPS, AFP negative, silent carrier for alpha thalassemia  ?Maternal Ultrasounds/Referrals: Normal ?Fetal Ultrasounds or other Referrals:  None ?Maternal Substance Abuse:  No ?Significant Maternal Medications:  None ?Significant Maternal Lab Results: Group B Strep positive ? ?Results for orders placed or performed during the hospital encounter of 05/17/21 (from the past 24 hour(s))  ?Wet prep, genital  ? Collection Time: 05/17/21  8:59 PM  ? Specimen: Vaginal  ?Result Value Ref Range  ? Yeast Wet Prep HPF POC PRESENT (A) NONE SEEN  ? Trich, Wet Prep NONE SEEN NONE SEEN  ? Clue Cells Wet Prep HPF POC PRESENT (A) NONE SEEN  ? WBC, Wet Prep HPF POC >=10 (A) <10  ? Sperm NONE SEEN   ? ? ?Patient Active Problem List  ? Diagnosis Date Noted  ? Normal labor 05/17/2021  ? GBS (group B Streptococcus carrier), +RV culture, currently pregnant 04/29/2021  ? Alpha thalassemia silent carrier 11/22/2020  ? Trichomonal vaginitis during pregnancy in first trimester 11/03/2020  ? Supervision of normal first pregnancy, antepartum 10/18/2020  ? ? ?Assessment/Plan:  ?Kelly Meadows is a 21 y.o. G1P0 at [redacted]w[redacted]d here for SOL.  ? ?#Labor: Favorable SVE and having regular, painful contractions. Will continue expectant management and reassess in 4 hours.  ?#Pain: Requesting epidural, will notify anesthesia  ?#FWB: Cat 1  ?#ID:  GBS pos; PCN ordered  ?#MOF: Breast  ?#MOC: Unsure  ?#Circ:  Desires  ? ?#Hx of trichomonas in pregnancy: Treated, TOC negative.  ? ?#Yeast vaginitis/BV: Positive on wet prep in MAU. Plan to treat postpartum.  ? ?[redacted]w[redacted]d, MD  ?05/18/2021, 12:08 AM ? ? ? ?

## 2021-05-18 NOTE — Lactation Note (Addendum)
This note was copied from a baby's chart. ?Lactation Consultation Note ? ?Patient Name: Kelly Meadows ?Today's Date: 05/18/2021 ?Reason for consult: Initial assessment;Primapara;1st time breastfeeding;Term;Breastfeeding assistance;Other (Comment) ?Age:23 hours ?2nd latch was an improvement with no pinching after using reverse pressure , more swallows and per mom comfortable. Latch 8  ?Baby release on his own, seemed satisfied, settled and then was sucking on his hand.  ?Per mom all the ultra sounds the baby was sucking on his hands.  ? ?Maternal Data ?Has patient been taught Hand Expression?: Yes ?Does the patient have breastfeeding experience prior to this delivery?: No ? ?Feeding ?Mother's Current Feeding Choice: Breast Milk ? ?LATCH Score ?Latch: Grasps breast easily, tongue down, lips flanged, rhythmical sucking. ? ?Audible Swallowing: A few with stimulation ? ?Type of Nipple: Everted at rest and after stimulation ? ?Comfort (Breast/Nipple): Soft / non-tender ? ?Hold (Positioning): Assistance needed to correctly position infant at breast and maintain latch. ? ?LATCH Score: 8 ? ? ?Lactation Tools Discussed/Used ?  ? ?Interventions ?Interventions: Breast feeding basics reviewed;Assisted with latch;Skin to skin;Breast massage;Hand express;Reverse pressure;Breast compression;Adjust position;Support pillows;Position options;Shells;Education;LC Services brochure ? ?Discharge ?Pump: DEBP;Personal ?WIC Program: Yes ? ?Consult Status ?Consult Status: Follow-up ?Date: 05/18/21 ?Follow-up type: In-patient ? ? ? ?Matilde Sprang Raveen Wieseler ?05/18/2021, 9:15 AM ? ? ? ?

## 2021-05-18 NOTE — Anesthesia Preprocedure Evaluation (Signed)
Anesthesia Evaluation  ?Patient identified by MRN, date of birth, ID band ?Patient awake ? ? ? ?Reviewed: ?Allergy & Precautions, NPO status , Patient's Chart, lab work & pertinent test results ? ?Airway ?Mallampati: II ? ?TM Distance: >3 FB ?Neck ROM: Full ? ? ? Dental ?no notable dental hx. ? ?  ?Pulmonary ?neg pulmonary ROS,  ?  ?Pulmonary exam normal ?breath sounds clear to auscultation ? ? ? ? ? ? Cardiovascular ?negative cardio ROS ?Normal cardiovascular exam ?Rhythm:Regular Rate:Normal ? ? ?  ?Neuro/Psych ?negative neurological ROS ? negative psych ROS  ? GI/Hepatic ?negative GI ROS, Neg liver ROS,   ?Endo/Other  ?negative endocrine ROS ? Renal/GU ?negative Renal ROS  ?negative genitourinary ?  ?Musculoskeletal ?negative musculoskeletal ROS ?(+)  ? Abdominal ?  ?Peds ? Hematology ?negative hematology ROS ?(+)   ?Anesthesia Other Findings ?Presented in labor ? Reproductive/Obstetrics ?(+) Pregnancy ? ?  ? ? ? ? ? ? ? ? ? ? ? ? ? ?  ?  ? ? ? ? ? ? ? ? ?Anesthesia Physical ?Anesthesia Plan ? ?ASA: 2 ? ?Anesthesia Plan: Epidural  ? ?Post-op Pain Management:   ? ?Induction:  ? ?PONV Risk Score and Plan: Treatment may vary due to age or medical condition ? ?Airway Management Planned: Natural Airway ? ?Additional Equipment:  ? ?Intra-op Plan:  ? ?Post-operative Plan:  ? ?Informed Consent: I have reviewed the patients History and Physical, chart, labs and discussed the procedure including the risks, benefits and alternatives for the proposed anesthesia with the patient or authorized representative who has indicated his/her understanding and acceptance.  ? ? ? ? ? ?Plan Discussed with: Anesthesiologist ? ?Anesthesia Plan Comments: (Patient identified. Risks, benefits, options discussed with patient including but not limited to bleeding, infection, nerve damage, paralysis, failed block, incomplete pain control, headache, blood pressure changes, nausea, vomiting, reactions to  medication, itching, and post partum back pain. Confirmed with bedside nurse the patient's most recent platelet count. Confirmed with the patient that they are not taking any anticoagulation, have any bleeding history or any family history of bleeding disorders. Patient expressed understanding and wishes to proceed. All questions were answered. )  ? ? ? ? ? ? ?Anesthesia Quick Evaluation ? ?

## 2021-05-18 NOTE — Discharge Summary (Signed)
? ?  Postpartum Discharge Summary ? ?Date of Service updated ? ?   ?Patient Name: Kelly Meadows ?DOB: [birth year] ?MRN: [id number] ? ?Date of admission: 05/17/2021 ?Delivery date:05/18/2021  ?Delivering provider: Genia Del  ?Date of discharge: 05/20/2021 ? ?Admitting diagnosis: Normal labor [O80, Z37.9] ?Intrauterine pregnancy: [redacted]w[redacted]d    ?Secondary diagnosis:  Principal Problem: ?  Vaginal delivery ?Active Problems: ?  Supervision of normal first pregnancy, antepartum ?  Trichomonal vaginitis during pregnancy in first trimester ?  Alpha thalassemia silent carrier ?  GBS (group B Streptococcus carrier), +RV culture, currently pregnant ?  Normal labor ? ?Additional problems: none    ?Discharge diagnosis: Term Pregnancy Delivered                                              ?Post partum procedures: none ?Augmentation:  None ?Complications: None ? ?Hospital course: Onset of Labor With Vaginal Delivery      ?21y.o. yo G1P0 at 3109w4das admitted in Latent Labor on 05/17/2021. Patient had an uncomplicated labor course and progressed to complete without augmentation. She had a normal vaginal delivery.  ?Membrane Rupture Time/Date: 2:30 AM ,05/18/2021   ?Delivery Method:Vaginal, Spontaneous  ?Episiotomy: None  ?Lacerations:  2nd degree  ?Patient had an uncomplicated postpartum course.  She is ambulating, tolerating a regular diet, passing flatus, and urinating well.  Patient is discharged home in stable condition on 05/20/21. ? ?Newborn Data: ?Birth date:05/18/2021  ?Birth time:2:34 AM  ?Gender:Female  ?Living status:Living  ?Apgars:8 ,9  ?Weight:3180 g  ? ?Magnesium Sulfate received: No ?BMZ received: No ?Rhophylac: N/A ?MMR: N/A ?T-DaP: Given prenatally ?Flu: No ?Transfusion: No ? ?Physical exam  ?Vitals:  ? 05/19/21 0700 05/19/21 1441 05/19/21 2054 05/20/21 0553  ?BP: 103/66 (!) 95/51 110/66 100/60  ?Pulse: 81 99 87 82  ?Resp: _0 ?Temp: 98.2 ?F (36.8 ?C) (!) 97.5 ?F (36.4 ?C) 97.9 ?F (36.6 ?C) 98 ?F (36.7 ?C)   ?TempSrc: Oral Oral Oral Oral  ?SpO2: 99% 96% 100% 100%  ?Weight:      ?Height:      ? ?General: alert, cooperative, and no distress ?Lochia: appropriate ?Uterine Fundus: firm ?DVT Evaluation: No evidence of DVT seen on physical exam. ? ?Labs: ?Lab Results  ?Component Value Date  ? WBC 13.2 (H) 05/18/2021  ? HGB 13.0 05/18/2021  ? HCT 38.5 05/18/2021  ? MCV 84.6 05/18/2021  ? PLT 175 05/18/2021  ? ? ?  Latest Ref Rng & Units 01/15/2021  ?  6:21 PM  ?CMP  ?Glucose 70 - 99 mg/dL 71    ?BUN 6 - 20 mg/dL 10    ?Creatinine 0.44 - 1.00 mg/dL 0.40    ?Sodium 135 - 145 mmol/L 136    ?Potassium 3.5 - 5.1 mmol/L 3.7    ?Chloride 98 - 111 mmol/L 109    ?CO2 22 - 32 mmol/L 20    ?Calcium 8.9 - 10.3 mg/dL 9.1    ?Total Protein 6.5 - 8.1 g/dL 7.1    ?Total Bilirubin 0.3 - 1.2 mg/dL 0.4    ?Alkaline Phos 38 - 126 U/L 75    ?AST 15 - 41 U/L 18    ?ALT 0 - 44 U/L 30    ? ?Edinburgh Score: ? ?  05/18/2021  ?  6:30 AM  ?EdFlavia Shipperostnatal Depression Scale Screening Tool  ?  I have been able to laugh and see the funny side of things. 0  ?I have looked forward with enjoyment to things. 0  ?I have blamed myself unnecessarily when things went wrong. 0  ?I have been anxious or worried for no good reason. 1  ?I have felt scared or panicky for no good reason. 0  ?Things have been getting on top of me. 0  ?I have been so unhappy that I have had difficulty sleeping. 0  ?I have felt sad or miserable. 0  ?I have been so unhappy that I have been crying. 0  ?The thought of harming myself has occurred to me. 0  ?Edinburgh Postnatal Depression Scale Total 1  ? ? ? ?After visit meds:  ?Allergies as of 05/20/2021   ?No Known Allergies ?  ? ?  ?Medication List  ?  ? ?STOP taking these medications   ? ?metroNIDAZOLE 500 MG tablet ?Commonly known as: FLAGYL ?  ? ?  ? ?TAKE these medications   ? ?hydrocortisone 25 MG suppository ?Commonly known as: ANUSOL-HC ?Place 1 suppository (25 mg total) rectally 2 (two) times daily. ?  ?ibuprofen 600 MG tablet ?Commonly  known as: ADVIL ?Take 1 tablet (600 mg total) by mouth every 6 (six) hours. ?  ?multivitamin-prenatal 27-0.8 MG Tabs tablet ?Take 1 tablet by mouth daily at 12 noon. ?  ?polyethylene glycol 17 g packet ?Commonly known as: MIRALAX / GLYCOLAX ?Take 17 g by mouth 2 (two) times daily. ?  ?terconazole 0.8 % vaginal cream ?Commonly known as: TERAZOL 3 ?Place 1 applicator vaginally at bedtime. ?  ? ?  ? ? ?Discharge home in stable condition ?Infant Feeding: Breast ?Infant Disposition: home with mother ?Discharge instruction: per After Visit Summary and Postpartum booklet. ?Activity: Advance as tolerated. Pelvic rest for 6 weeks.  ?Diet: routine diet ?Future Appointments: ?Future Appointments  ?Date Time Provider [place]  ?06/28/2021  2:10 PM Aletha Halim, MD [facility]  ? ?Follow up Visit: ?Message sent to [facility] by Dr. Gwenlyn Perking on 05/18/21.  ? ?Please schedule this patient for a In person postpartum visit in 6 weeks with the following provider: Any provider. ?Additional Postpartum F/U: None   ?Low risk pregnancy complicated by:  None ?Delivery mode:  Vaginal, Spontaneous  ?Anticipated Birth Control:  Unsure. Patient provided with information regarding contraceptive choices ? ?05/20/2021 ?Renee Harder, CNM ? ? ? ?

## 2021-05-19 NOTE — Progress Notes (Signed)
POSTPARTUM PROGRESS NOTE ? ?Post Partum Day 1 ? ?Subjective: ? ?Kelly Meadows is a 21 y.o. G1P1001 s/p SVD at 100w4d.  She reports she is doing well. No acute events overnight. She denies any problems with ambulating, voiding or po intake. Denies nausea or vomiting.  Pain is well controlled.  Lochia is mild. ? ?Objective: ?Blood pressure 103/66, pulse 81, temperature 98.2 ?F (36.8 ?C), temperature source Oral, resp. rate 16, height 5\' 5"  (1.651 m), weight 91.9 kg, last menstrual period 07/18/2020, SpO2 99 %, unknown if currently breastfeeding. ? ?Physical Exam:  ?General: alert, cooperative and no distress ?Chest: no respiratory distress ?Heart:regular rate, distal pulses intact ?Uterine Fundus: firm, appropriately tender ?DVT Evaluation: No calf swelling or tenderness ?Extremities: minimal edema ?Skin: warm, dry ? ?Recent Labs  ?  05/18/21 ?0006  ?HGB 13.0  ?HCT 38.5  ? ? ?Assessment/Plan: ?Kelly Meadows is a 21 y.o. G1P1001 s/p SVD at [redacted]w[redacted]d  ? ?PPD#1 - Doing well ? Routine postpartum care ? ?Consented mom for neonatal circ, note placed into his chart.  ? ?Contraception: unsure ?Feeding: breast ?Dispo: Plan for discharge tomorrow. Infant being observed for 48 hours due to inadequate GBS coverage. ? ? LOS: 2 days  ? ?[redacted]w[redacted]d, DO  ?OB Fellow  ?05/19/2021, 10:22 AM  ?

## 2021-05-19 NOTE — Social Work (Signed)
CSW received consult for hx of marijuana use.  Referral was screened out due to the following: ? ?~MOB had no documented substance use after initial prenatal visit/+UPT. ?~MOB had no positive drug screens after initial prenatal visit/+UPT. ?~Baby's UDS is negative. ? ?Per chart review, it is noted that MOB does not currently use. MOB stopped approximately 1-2 months ago dated: 03-31-2020.  ? ?Please consult CSW if current concerns arise or by MOB's request. CSW will monitor CDS results and make report to Child Protective Services if warranted.  ? ?Vivi Barrack, MSW, LCSW ?[facility]  ?Clinical Social Worker  ?[phone] ?05/19/2021  9:17 AM  ?

## 2021-05-19 NOTE — Lactation Note (Signed)
This note was copied from a baby's chart. ?Lactation Consultation Note ? ?Patient Name: Kelly Meadows ?Today's Date: 05/19/2021 ?Reason for consult: Follow-up assessment;Primapara;1st time breastfeeding;Term ?Age:23 hours ? ? ?P1 mother whose infant is now 43 hours old.  This is a term baby at 39+4 weeks.  Mother's current feeding preference is breast. ? ?RN requested a lactation consult due to mother feeling anxious with questions. Reviewed baby's chart and he has been feeding, voiding and stooling.  Stools are starting to transition.  Discussed weight loss of 4% being WNL and informed her that he will most likely have a little bit more weight loss tomorrow.  Mother feels like she can usually latch well until his circumcision today.  Reassurance given and explained that sleepiness is a possibility after circumcision and that he may awaken and feed more tonight; resembling cluster feeding.  Encouraged to feed on cue whenever baby desires.  She will continue breast massage and hand expression. ? ?Per RN, mother was asking for formula earlier.  I do not see a need for formula at this time, although, it is always mother's preference if desired.  Suggested she always feed him STS (he has clothing on now) to help awaken him and keep him awake with feedings.  Continue to educate and support mother.  Visitor present.  RN updated. ? ? ?Maternal Data ?Has patient been taught Hand Expression?: Yes ?Does the patient have breastfeeding experience prior to this delivery?: No ? ?Feeding ?Mother's Current Feeding Choice: Breast Milk ? ?LATCH Score ?  ? ?  ? ?  ? ?  ? ?  ? ?  ? ? ?Lactation Tools Discussed/Used ?Breast pump type: Manual ? ?Interventions ?Interventions: Breast feeding basics reviewed;Education ? ?Discharge ?Pump: Personal ?Simpson Program: Yes ? ?Consult Status ?Consult Status: Follow-up ?Date: 05/20/21 ?Follow-up type: In-patient ? ? ? ?Suheyb Raucci R Nafeesa Dils ?05/19/2021, 5:36 PM ? ? ? ?

## 2021-05-20 MED ORDER — IBUPROFEN 600 MG PO TABS: 600.0000 mg | ORAL_TABLET | Freq: Four times a day (QID) | ORAL | 0 refills | Status: DC

## 2021-05-20 NOTE — Lactation Note (Signed)
This note was copied from a baby's chart. ?Lactation Consultation Note ? ?Patient Name: Kelly Meadows ?Today's Date: 05/20/2021 ?  ?Age:23 hours ? ?Mom shallow latches with nipple bruising. Currently only able to latch on left breast. Right nipple bruise but skin intact.  ? ?Mom using coconut oil prior to pumping. Breast shells not pumping, sleeping or nursing.  ? ?Plan 1 to feed based on cues 8-12x 24hr period.  ?2. Mom to offer breast and get more depth with latch in cross cradle prone with infant on stomach. Mom taking breast rest on one side due to soreness.  ?3. Supplementing with EBM first followed by formula 18-25 ml per feeding with yellow slow flow nipple and pace bottle feeding.  ?4. Manual pump with 27 flange q 3hrs for 10 min each breast. Mom to change to personal electric pump at home.  ?All questions answered at the end of the visit.  ? ?Maternal Data ?  ? ?Feeding ?  ? ?LATCH Score ?  ? ?  ? ?  ? ?  ? ?  ? ?  ? ? ?Lactation Tools Discussed/Used ?  ? ?Interventions ?  ? ?Discharge ?  ? ?Consult Status ?  ? ? ? ?Prinston Kynard  Nicholson-Springer ?05/20/2021, 2:41 PM ? ? ? ?

## 2021-05-22 ENCOUNTER — Telehealth: Payer: Self-pay

## 2021-05-22 NOTE — Telephone Encounter (Signed)
Transition Care Management Unsuccessful Follow-up Telephone Call ? ?Date of discharge and from where:  05/20/2021 from [facility] ? ?Attempts:  1st Attempt ? ?Reason for unsuccessful TCM follow-up call:  Unable to leave message ? ? ? ?

## 2021-05-23 NOTE — Telephone Encounter (Signed)
Transition Care Management Follow-up Telephone Call ?Date of discharge and from where: 05/20/2021 from [facility] ?How have you been since you were released from the hospital? Patient stated that she is feeling and did not have any questions or concerns at this time.  ?Any questions or concerns? No ? ?Items Reviewed: ?Did the pt receive and understand the discharge instructions provided? Yes  ?Medications obtained and verified? Yes  ?Other? No  ?Any new allergies since your discharge? No  ?Dietary orders reviewed? No ?Do you have support at home? Yes  ? ?Functional Questionnaire: (I = Independent and D = Dependent) ?ADLs: I ? ?Bathing/Dressing- I ? ?Meal Prep- I ? ?Eating- I ? ?Maintaining continence- I ? ?Transferring/Ambulation- I ? ?Managing Meds- I ? ? ?Follow up appointments reviewed: ? ?PCP Hospital f/u appt confirmed? No   ?Specialist Hospital f/u appt confirmed? Yes  Scheduled to see Aletha Halim, MD on 06/28/2021 @ 2:10pm. ?Are transportation arrangements needed? No  ?If their condition worsens, is the pt aware to call PCP or go to the Emergency Dept.? Yes ?Was the patient provided with contact information for the PCP's office or ED? Yes ?Was to pt encouraged to call back with questions or concerns? Yes ? ?

## 2021-05-25 ENCOUNTER — Encounter: Payer: BC Managed Care – PPO | Admitting: Obstetrics & Gynecology

## 2021-05-26 ENCOUNTER — Telehealth (HOSPITAL_COMMUNITY): Payer: Self-pay

## 2021-05-26 NOTE — Telephone Encounter (Signed)
"  I'm great. I have been having small clots. Is that normal?" RN told patient that small period sized clots are ok. RN reviewed what to much bleeding look like. "My bleeding has been ok." Patient has no other concerns or questions about her healing. ? ?"He's fine. Eating and gaining weight. He sleeps in a bassinet." RN reviewed ABC's of safe sleep with patient. Patient declines any questions or concerns about baby. ? ?RN explained EPDS to patient. Patient states that she just did an EPDS with someone who called from the health department. Patient does not remember her score but states it was good. Patient does not wish to do EPDS at this time.  ? Heron Nay ?05/12//2023,1740 ?

## 2021-06-02 DIAGNOSIS — L539 Erythematous condition, unspecified: Secondary | ICD-10-CM | POA: Insufficient documentation

## 2021-06-02 DIAGNOSIS — R000 Tachycardia, unspecified: Secondary | ICD-10-CM | POA: Insufficient documentation

## 2021-06-02 DIAGNOSIS — N61 Mastitis without abscess: Secondary | ICD-10-CM | POA: Insufficient documentation

## 2021-06-02 DIAGNOSIS — N644 Mastodynia: Secondary | ICD-10-CM | POA: Diagnosis present

## 2021-06-03 ENCOUNTER — Emergency Department (HOSPITAL_COMMUNITY)
Admission: EM | Admit: 2021-06-03 | Discharge: 2021-06-03 | Disposition: A | Discharge status: Home / Self Care | Payer: BC Managed Care – PPO | Attending: Emergency Medicine | Admitting: Emergency Medicine

## 2021-06-03 ENCOUNTER — Encounter (HOSPITAL_COMMUNITY): Payer: Self-pay | Admitting: Emergency Medicine

## 2021-06-03 DIAGNOSIS — N61 Mastitis without abscess: Secondary | ICD-10-CM | POA: Diagnosis not present

## 2021-06-03 MED ORDER — ACETAMINOPHEN 500 MG PO TABS
1000.0000 mg | ORAL_TABLET | Freq: Once | ORAL | Status: AC
  Administered 2021-06-03: 1000 mg via ORAL
  Filled 2021-06-03: qty 2

## 2021-06-03 MED ORDER — AMOXICILLIN-POT CLAVULANATE 875-125 MG PO TABS
1.0000 | ORAL_TABLET | Freq: Once | ORAL | Status: AC
Start: 2021-06-03 — End: 2021-06-03
  Administered 2021-06-03: 1 via ORAL
  Filled 2021-06-03: qty 1

## 2021-06-03 MED ORDER — AMOXICILLIN-POT CLAVULANATE 875-125 MG PO TABS: 1.0000 | ORAL_TABLET | Freq: Two times a day (BID) | ORAL | 0 refills | Status: AC

## 2021-06-03 MED ORDER — ACETAMINOPHEN 325 MG PO TABS
ORAL_TABLET | ORAL | Status: AC
  Filled 2021-06-03: qty 2

## 2021-06-03 MED ORDER — IBUPROFEN 200 MG PO TABS
600.0000 mg | ORAL_TABLET | Freq: Once | ORAL | Status: AC
  Administered 2021-06-03: 600 mg via ORAL
  Filled 2021-06-03: qty 3

## 2021-06-03 NOTE — ED Provider Notes (Signed)
[facility]-EMERGENCY DEPT Provider Note   CSN: [id number] Arrival date & time: 06/02/21  2355     History  Chief Complaint  Patient presents with   Breast Pain    [facility] is a 21 y.o. female who is 16 days postpartum who presents with concern for redness   , Swelling, and pain in the right breast for 24 hours.  Chills for 2 days, fever 103 upon arrival to the ED.  No nausea, vomiting, or diarrhea.  Patient is exclusively breast-feeding and pumping her new infant.  I personally reviewed this patient's medical records.  She has history of STI and pregnancy, is not on any medications daily.  HPI     Home Medications Prior to Admission medications   Medication Sig Start Date End Date Taking? Authorizing Provider  amoxicillin-clavulanate (AUGMENTIN) 875-125 MG tablet Take 1 tablet by mouth every 12 (twelve) hours for 10 days. 06/03/21 06/13/21 Yes Dezmen Alcock, Eugene Gavia, PA-C  hydrocortisone (ANUSOL-HC) 25 MG suppository Place 1 suppository (25 mg total) rectally 2 (two) times daily. Patient not taking: Reported on 05/18/2021 03/06/21   Tereso Newcomer, MD  ibuprofen (ADVIL) 600 MG tablet Take 1 tablet (600 mg total) by mouth every 6 (six) hours. 05/20/21   Brand Males, CNM  polyethylene glycol (MIRALAX / GLYCOLAX) 17 g packet Take 17 g by mouth 2 (two) times daily. Patient not taking: Reported on 05/18/2021 04/27/21    Bing, MD  Prenatal Vit-Fe Fumarate-FA (MULTIVITAMIN-PRENATAL) 27-0.8 MG TABS tablet Take 1 tablet by mouth daily at 12 noon.    [provider]  terconazole (TERAZOL 3) 0.8 % vaginal cream Place 1 applicator vaginally at bedtime. Patient not taking: Reported on 05/18/2021 05/10/21   Reva Bores, MD      Allergies    Patient has no known allergies.    Review of Systems   Review of Systems  Skin:        Right breast redness, swelling, pain.    Physical Exam Updated Vital Signs BP 110/69   Pulse (!) 121   Temp  (!) 101.1 F (38.4 C) (Oral)   Resp 16   Ht 5\' 5"  (1.651 m)   Wt 90.7 kg   SpO2 99%   Breastfeeding Yes   BMI 33.28 kg/m  Physical Exam Vitals and nursing note reviewed. Exam conducted with a chaperone present.  Constitutional:      Appearance: She is not ill-appearing or toxic-appearing.  HENT:     Head: Normocephalic and atraumatic.     Nose: Nose normal.     Mouth/Throat:     Mouth: Mucous membranes are moist.     Pharynx: No oropharyngeal exudate or posterior oropharyngeal erythema.  Eyes:     General:        Right eye: No discharge.        Left eye: No discharge.     Conjunctiva/sclera: Conjunctivae normal.  Cardiovascular:     Rate and Rhythm: Regular rhythm. Tachycardia present.     Pulses: Normal pulses.     Heart sounds: Normal heart sounds. No murmur heard. Pulmonary:     Effort: Pulmonary effort is normal. No respiratory distress.     Breath sounds: Normal breath sounds. No wheezing or rales.  Chest:    Abdominal:     General: Bowel sounds are normal. There is no distension.     Palpations: Abdomen is soft.     Tenderness: There is no abdominal tenderness. There is  no right CVA tenderness, left CVA tenderness, guarding or rebound.  Musculoskeletal:        General: No deformity.     Cervical back: Neck supple.     Right lower leg: No edema.     Left lower leg: No edema.  Skin:    General: Skin is warm and dry.     Capillary Refill: Capillary refill takes less than 2 seconds.     Findings: Erythema present.  Neurological:     Mental Status: She is alert. Mental status is at baseline.  Psychiatric:        Mood and Affect: Mood normal.    ED Results / Procedures / Treatments   Labs (all labs ordered are listed, but only abnormal results are displayed) Labs Reviewed - No data to display  EKG None  Radiology No results found.  Procedures Procedures    Medications Ordered in ED Medications  acetaminophen (TYLENOL) tablet 1,000 mg (1,000 mg  Oral Given 06/03/21 0017)  amoxicillin-clavulanate (AUGMENTIN) 875-125 MG per tablet 1 tablet (1 tablet Oral Given 06/03/21 0155)  ibuprofen (ADVIL) tablet 600 mg (600 mg Oral Given 06/03/21 0155)    ED Course/ Medical Decision Making/ A&P                           Medical Decision Making 21 year old female presents with concern for right breast soreness and redness x24 hours, 2 weeks postpartum and breast-feeding.  Febrile 203 F tachycardic to the 110s on arrival, fever improved after Tylenol as well as heart rate now 108.  Cardiopulmonary exam revealed tachycardia with regular rhythm.  Abdominal exam is benign.  Breast exam as above with mild erythema without area of fluctuance to suggest abscess.  No nipple drainage.  Risk OTC drugs.   Clinical picture and physical exam was consistent with mastitis of the right breast.  Administered first dose of antibiotics in the emergency department and discharged home with prescription for antibiotics and recommendation to use OTC antipyretics as necessary.  Recommend close outpatient follow-up with her OB/GYN next week.  No further work-up warranted in the ER at this time.  [facility]  voiced understanding of her medical evaluation and treatment plan. Each of their questions answered to their expressed satisfaction.  Return precautions were given.  Patient is well-appearing, stable, and was discharged in good condition.  This chart was dictated using voice recognition software, Dragon. Despite the best efforts of this provider to proofread and correct errors, errors may still occur which can change documentation meaning.  Final Clinical Impression(s) / ED Diagnoses Final diagnoses:  Mastitis    Rx / DC Orders ED Discharge Orders          Ordered    amoxicillin-clavulanate (AUGMENTIN) 875-125 MG tablet  Every 12 hours        06/03/21 0146              Guliana Weyandt, Eugene Gavia, PA-C 06/03/21 5521    Gilda Crease, MD 06/03/21  [phone]

## 2021-06-03 NOTE — ED Triage Notes (Signed)
Pt has 42 week old at home and right breast is swollen. She called nurse line and thinks she has mastitis. Fever of 103.1. Breast is tender and swollen.

## 2021-06-03 NOTE — ED Notes (Signed)
Discharge instructions reviewed, questions answered. Rx education provided. Pt states understanding and no further questions. Pt ambulatory with steady gait upon discharge. No s/s of distress noted. ? ?

## 2021-06-03 NOTE — Discharge Instructions (Addendum)
You have been diagnosed with mastitis, an infection of the breast that is common in breast feeding. Please follow up with your obgyn for reevaluation of your mastitis.   Return to the ER with any worsening fevers, nausea or vomiting, worsening redness or swelling to the breast, or any other severe symptom.

## 2021-06-05 ENCOUNTER — Telehealth: Payer: Self-pay

## 2021-06-05 NOTE — Telephone Encounter (Signed)
Transition Care Management Unsuccessful Follow-up Telephone Call  Date of discharge and from where:  06/03/2021 from [place]  Attempts:  1st Attempt  Reason for unsuccessful TCM follow-up call:  Left voice message    

## 2021-06-06 NOTE — Telephone Encounter (Signed)
Transition Care Management Follow-up Telephone Call Date of discharge and from where: 06/03/2021 from [facility]  How have you been since you were released from the hospital? Patient stated that she is feeling some better and did not have any questions or concerns at this time.  Any questions or concerns? No  Items Reviewed: Did the pt receive and understand the discharge instructions provided? Yes  Medications obtained and verified? Yes  Other? No  Any new allergies since your discharge? No  Dietary orders reviewed? No Do you have support at home? Yes   Functional Questionnaire: (I = Independent and D = Dependent) ADLs: I  Bathing/Dressing- I  Meal Prep- I  Eating- I  Maintaining continence- I  Transferring/Ambulation- I  Managing Meds- I   Follow up appointments reviewed:  PCP Hospital f/u appt confirmed? No Patient only sees OBGYN.  [facility] f/u appt confirmed? Yes  Scheduled to see Atlee Abide, MD (OBGYN) on 06/28/2021 Are transportation arrangements needed? No  If their condition worsens, is the pt aware to call PCP or go to the Emergency Dept.? Yes Was the patient provided with contact information for the PCP's office or ED? Yes Was to pt encouraged to call back with questions or concerns? Yes

## 2021-06-15 DIAGNOSIS — Z419 Encounter for procedure for purposes other than remedying health state, unspecified: Secondary | ICD-10-CM | POA: Diagnosis not present

## 2021-06-28 ENCOUNTER — Encounter: Payer: Self-pay | Admitting: Obstetrics and Gynecology

## 2021-06-28 ENCOUNTER — Ambulatory Visit (INDEPENDENT_AMBULATORY_CARE_PROVIDER_SITE_OTHER): Payer: BC Managed Care – PPO | Admitting: Obstetrics and Gynecology

## 2021-06-28 MED ORDER — NORETHIN ACE-ETH ESTRAD-FE 1-20 MG-MCG(24) PO TABS: 1.0000 | ORAL_TABLET | Freq: Every day | ORAL | 3 refills | Status: DC

## 2021-06-28 NOTE — Progress Notes (Signed)
    Post Partum Visit Note  Kelly Meadows is a 21 y.o. G1P1001 s/p SVD/2nd degree at 39wks after presenting in active labor. Anesthesia: epidural. Postpartum course has been unremarkable. Baby is doing well. Baby is feeding by  Bottle  Daron Offer . Bleeding no bleeding. Bowel function is  Constipation  . Bladder function is normal. Patient is sexually active and reports no problems or issues Contraception method is none. Postpartum depression screening: negative.    Edinburgh Postnatal Depression Scale - 06/28/21 1433       Edinburgh Postnatal Depression Scale:  In the Past 7 Days   I have been able to laugh and see the funny side of things. 0    I have looked forward with enjoyment to things. 0    I have blamed myself unnecessarily when things went wrong. 0    I have been anxious or worried for no good reason. 0    I have felt scared or panicky for no good reason. 0    Things have been getting on top of me. 0    I have been so unhappy that I have had difficulty sleeping. 0    I have felt sad or miserable. 0    I have been so unhappy that I have been crying. 0    The thought of harming myself has occurred to me. 0    Edinburgh Postnatal Depression Scale Total 0              Review of Systems Pertinent items noted in HPI and remainder of comprehensive ROS otherwise negative.  Objective:  BP 111/74   Pulse 74   Wt 179 lb (81.2 kg)   Breastfeeding No   BMI 29.79 kg/m   NAD Abdomen: soft, nttp, nd EGBUS normal  Assessment:     Normal postpartum exam.   Plan:  D/w patient re: various types of contraception, risks/benefits/alternatives and she would like to try combined OCPs. Patient told to do abstinence x 2 weeks and if negative UPT, okay to start. - Last pap smear  Diagnosis  Date Value Ref Range Status  11/03/2020   Final   - Negative for intraepithelial lesion or malignancy (NILM)   RTC: 8m for annual    [place] Bing, MD [facility] [employer], [facility]

## 2021-06-29 ENCOUNTER — Encounter: Payer: Self-pay | Admitting: Obstetrics and Gynecology

## 2021-07-15 DIAGNOSIS — Z419 Encounter for procedure for purposes other than remedying health state, unspecified: Secondary | ICD-10-CM | POA: Diagnosis not present

## 2021-08-15 DIAGNOSIS — Z419 Encounter for procedure for purposes other than remedying health state, unspecified: Secondary | ICD-10-CM | POA: Diagnosis not present

## 2021-09-15 DIAGNOSIS — Z419 Encounter for procedure for purposes other than remedying health state, unspecified: Secondary | ICD-10-CM | POA: Diagnosis not present

## 2021-09-28 ENCOUNTER — Ambulatory Visit (INDEPENDENT_AMBULATORY_CARE_PROVIDER_SITE_OTHER): Payer: BC Managed Care – PPO | Admitting: Obstetrics and Gynecology

## 2021-09-28 ENCOUNTER — Encounter: Payer: Self-pay | Admitting: Obstetrics and Gynecology

## 2021-09-28 ENCOUNTER — Other Ambulatory Visit (HOSPITAL_COMMUNITY)
Admission: RE | Admit: 2021-09-28 | Discharge: 2021-09-28 | Disposition: A | Discharge status: Home / Self Care | Payer: BC Managed Care – PPO | Source: Ambulatory Visit | Attending: Obstetrics and Gynecology | Admitting: Obstetrics and Gynecology

## 2021-09-28 VITALS — BP 118/77 | HR 76 | Wt 174.0 lb

## 2021-09-28 DIAGNOSIS — Z01419 Encounter for gynecological examination (general) (routine) without abnormal findings: Secondary | ICD-10-CM | POA: Insufficient documentation

## 2021-09-28 DIAGNOSIS — Z3041 Encounter for surveillance of contraceptive pills: Secondary | ICD-10-CM

## 2021-09-28 DIAGNOSIS — N760 Acute vaginitis: Secondary | ICD-10-CM | POA: Insufficient documentation

## 2021-09-28 DIAGNOSIS — N921 Excessive and frequent menstruation with irregular cycle: Secondary | ICD-10-CM

## 2021-09-28 DIAGNOSIS — N898 Other specified noninflammatory disorders of vagina: Secondary | ICD-10-CM

## 2021-09-28 NOTE — Progress Notes (Unsigned)
On the 2nd pack now  Irregular spotting, no pain Had a period, not heavy or painful with last pack Stopped breast feeding a long time ago +sexually active

## 2021-10-02 LAB — CERVICOVAGINAL ANCILLARY ONLY
Bacterial Vaginitis (gardnerella): POSITIVE — AB
Candida Glabrata: NEGATIVE
Candida Vaginitis: NEGATIVE
Chlamydia: NEGATIVE
Comment: NEGATIVE
Comment: NEGATIVE
Comment: NEGATIVE
Comment: NEGATIVE
Comment: NEGATIVE
Comment: NORMAL
Neisseria Gonorrhea: NEGATIVE
Trichomonas: NEGATIVE

## 2021-10-04 MED ORDER — METRONIDAZOLE 500 MG PO TABS: 500.0000 mg | ORAL_TABLET | Freq: Two times a day (BID) | ORAL | 0 refills | Status: DC

## 2021-10-04 NOTE — Addendum Note (Signed)
Addended by: Aletha Halim on: 10/04/2021 07:55 AM   Modules accepted: Orders

## 2021-10-15 DIAGNOSIS — Z419 Encounter for procedure for purposes other than remedying health state, unspecified: Secondary | ICD-10-CM | POA: Diagnosis not present

## 2021-11-15 DIAGNOSIS — Z419 Encounter for procedure for purposes other than remedying health state, unspecified: Secondary | ICD-10-CM | POA: Diagnosis not present

## 2021-11-18 ENCOUNTER — Emergency Department (HOSPITAL_COMMUNITY): Payer: BC Managed Care – PPO

## 2021-11-18 ENCOUNTER — Encounter (HOSPITAL_COMMUNITY): Payer: Self-pay | Admitting: Emergency Medicine

## 2021-11-18 ENCOUNTER — Emergency Department (HOSPITAL_COMMUNITY)
Admission: EM | Admit: 2021-11-18 | Discharge: 2021-11-18 | Disposition: A | Discharge status: Home / Self Care | Payer: BC Managed Care – PPO | Attending: Emergency Medicine | Admitting: Emergency Medicine

## 2021-11-18 ENCOUNTER — Other Ambulatory Visit: Payer: Self-pay

## 2021-11-18 DIAGNOSIS — S0990XA Unspecified injury of head, initial encounter: Secondary | ICD-10-CM | POA: Diagnosis present

## 2021-11-18 DIAGNOSIS — S0083XA Contusion of other part of head, initial encounter: Secondary | ICD-10-CM | POA: Diagnosis not present

## 2021-11-18 LAB — POC URINE PREG, ED: Preg Test, Ur: NEGATIVE

## 2021-11-18 NOTE — Discharge Instructions (Signed)
Recheck with your primary care provider if pain continues next week.  Can apply ice to area for 20 minutes at a time.  Can take Motrin Tylenol as needed as directed for pain.
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
## 2021-11-18 NOTE — ED Notes (Signed)
Patient verbalizes understanding of discharge instructions. Opportunity for questioning and answers were provided. Armband removed by staff, pt discharged from ED. Ambulated out to lobby  
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
## 2021-12-15 DIAGNOSIS — Z419 Encounter for procedure for purposes other than remedying health state, unspecified: Secondary | ICD-10-CM | POA: Diagnosis not present

## 2022-01-15 DIAGNOSIS — Z419 Encounter for procedure for purposes other than remedying health state, unspecified: Secondary | ICD-10-CM | POA: Diagnosis not present

## 2022-02-02 ENCOUNTER — Emergency Department (HOSPITAL_COMMUNITY)
Admission: EM | Admit: 2022-02-02 | Discharge: 2022-02-02 | Disposition: A | Discharge status: Home / Self Care | Payer: BC Managed Care – PPO | Attending: Emergency Medicine | Admitting: Emergency Medicine

## 2022-02-02 ENCOUNTER — Other Ambulatory Visit: Payer: Self-pay

## 2022-02-02 ENCOUNTER — Encounter (HOSPITAL_COMMUNITY): Payer: Self-pay

## 2022-02-02 DIAGNOSIS — O26891 Other specified pregnancy related conditions, first trimester: Secondary | ICD-10-CM | POA: Diagnosis present

## 2022-02-02 DIAGNOSIS — Z3A09 9 weeks gestation of pregnancy: Secondary | ICD-10-CM | POA: Diagnosis not present

## 2022-02-02 DIAGNOSIS — B3731 Acute candidiasis of vulva and vagina: Secondary | ICD-10-CM | POA: Diagnosis not present

## 2022-02-02 DIAGNOSIS — B9689 Other specified bacterial agents as the cause of diseases classified elsewhere: Secondary | ICD-10-CM

## 2022-02-02 DIAGNOSIS — O26899 Other specified pregnancy related conditions, unspecified trimester: Secondary | ICD-10-CM

## 2022-02-02 DIAGNOSIS — O23591 Infection of other part of genital tract in pregnancy, first trimester: Secondary | ICD-10-CM | POA: Insufficient documentation

## 2022-02-02 LAB — WET PREP, GENITAL
Sperm: NONE SEEN
Trich, Wet Prep: NONE SEEN
WBC, Wet Prep HPF POC: >=10 — AB (ref <10)

## 2022-02-02 LAB — URINALYSIS, ROUTINE W REFLEX MICROSCOPIC
Bacteria, UA: NONE SEEN
Bilirubin Urine: NEGATIVE
Glucose, UA: NEGATIVE mg/dL
Hgb urine dipstick: NEGATIVE
Ketones, ur: 20 mg/dL — AB
Nitrite: NEGATIVE
Protein, ur: NEGATIVE mg/dL
Specific Gravity, Urine: 1.027 (ref 1.005–1.030)
pH: 6 (ref 5.0–8.0)

## 2022-02-02 LAB — GC/CHLAMYDIA PROBE AMP (~~LOC~~) NOT AT ARMC
Chlamydia: NEGATIVE
Comment: NEGATIVE
Comment: NORMAL
Neisseria Gonorrhea: NEGATIVE

## 2022-02-02 MED ORDER — CLOTRIMAZOLE 1 % VA CREA: 1.0000 | TOPICAL_CREAM | Freq: Every day | VAGINAL | 0 refills | Status: DC

## 2022-02-02 MED ORDER — METRONIDAZOLE 500 MG PO TABS: 500.0000 mg | ORAL_TABLET | Freq: Two times a day (BID) | ORAL | 0 refills | Status: DC

## 2022-02-02 NOTE — ED Provider Notes (Signed)
[facility] DEPT Provider Note   CSN: [id number] Arrival date & time: 02/02/22  0248     History  Chief Complaint  Patient presents with   Abdominal Pain    [facility] is a 22 y.o. female.  Presents to the emergency department for evaluation of lower abdominal pain.  Patient reports pain across the lower pelvis region, more on the right side when she urinates.  She has not had urinary frequency or change in urine volume.  Patient denies fever, vomiting.  She does have frequent nausea secondary to early pregnancy.  Her last menstrual period was number 20th which correlates to 8 weeks 4 days.  Patient reports ultrasound at outside clinic that showed IUP.  Patient reports that she plans to terminate this pregnancy.       Home Medications Prior to Admission medications   Medication Sig Start Date End Date Taking? Authorizing Provider  clotrimazole (GYNE-LOTRIMIN) 1 % vaginal cream Place 1 Applicatorful vaginally at bedtime for 14 days. 02/02/22 02/16/22 Yes Pegeen Stiger, Gwenyth Allegra, MD  metroNIDAZOLE (FLAGYL) 500 MG tablet Take 1 tablet (500 mg total) by mouth 2 (two) times daily. One po bid x 7 days 02/02/22  Yes Emberly Tomasso, Gwenyth Allegra, MD  Norethindrone Acetate-Ethinyl Estrad-FE (LOESTRIN 24 FE) 1-20 MG-MCG(24) tablet Take 1 tablet by mouth daily. 07/02/21   Aletha Halim, MD  prenatal vitamin w/FE, FA (PRENATAL 1 + 1) 27-1 MG TABS tablet Take 1 tablet by mouth daily at 12 noon.    [provider]      Allergies    Patient has no known allergies.    Review of Systems   Review of Systems  Physical Exam Updated Vital Signs BP 112/68   Pulse 100   Temp 98.5 F (36.9 C) (Oral)   Resp 18   Ht 5\' 5"  (1.651 m)   Wt 79.4 kg   SpO2 100%   BMI 29.12 kg/m  Physical Exam Vitals and nursing note reviewed. Exam conducted with a chaperone present.  Constitutional:      General: She is not in acute distress.    Appearance: She is  well-developed.  HENT:     Head: Normocephalic and atraumatic.     Mouth/Throat:     Mouth: Mucous membranes are moist.  Eyes:     General: Vision grossly intact. Gaze aligned appropriately.     Extraocular Movements: Extraocular movements intact.     Conjunctiva/sclera: Conjunctivae normal.  Cardiovascular:     Rate and Rhythm: Normal rate and regular rhythm.     Pulses: Normal pulses.     Heart sounds: Normal heart sounds, S1 normal and S2 normal. No murmur heard.    No friction rub. No gallop.  Pulmonary:     Effort: Pulmonary effort is normal. No respiratory distress.     Breath sounds: Normal breath sounds.  Abdominal:     General: Bowel sounds are normal.     Palpations: Abdomen is soft.     Tenderness: There is no abdominal tenderness. There is no guarding or rebound.     Hernia: No hernia is present. There is no hernia in the left inguinal area or right inguinal area.  Genitourinary:    General: Normal vulva.     Labia:        Right: No rash.        Left: No rash.      Vagina: Normal.     Cervix: No cervical motion tenderness or friability.  Uterus: Normal.      Adnexa: Right adnexa normal and left adnexa normal.  Musculoskeletal:        General: No swelling.     Cervical back: Full passive range of motion without pain, normal range of motion and neck supple. No spinous process tenderness or muscular tenderness. Normal range of motion.     Right lower leg: No edema.     Left lower leg: No edema.  Skin:    General: Skin is warm and dry.     Capillary Refill: Capillary refill takes less than 2 seconds.     Findings: No ecchymosis, erythema, rash or wound.  Neurological:     General: No focal deficit present.     Mental Status: She is alert and oriented to person, place, and time.     GCS: GCS eye subscore is 4. GCS verbal subscore is 5. GCS motor subscore is 6.     Cranial Nerves: Cranial nerves 2-12 are intact.     Sensory: Sensation is intact.     Motor: Motor  function is intact.     Coordination: Coordination is intact.  Psychiatric:        Attention and Perception: Attention normal.        Mood and Affect: Mood normal.        Speech: Speech normal.        Behavior: Behavior normal.     ED Results / Procedures / Treatments   Labs (all labs ordered are listed, but only abnormal results are displayed) Labs Reviewed  WET PREP, GENITAL - Abnormal; Notable for the following components:      Result Value   Yeast Wet Prep HPF POC PRESENT (*)    Clue Cells Wet Prep HPF POC PRESENT (*)    WBC, Wet Prep HPF POC >=10 (*)    All other components within normal limits  URINALYSIS, ROUTINE W REFLEX MICROSCOPIC - Abnormal; Notable for the following components:   Ketones, ur 20 (*)    Leukocytes,Ua TRACE (*)    All other components within normal limits  GC/CHLAMYDIA PROBE AMP ([facility]) NOT AT [facility]    EKG None  Radiology No results found.  Procedures Procedures    Medications Ordered in ED Medications - No data to display  ED Course/ Medical Decision Making/ A&P                             Medical Decision Making Amount and/or Complexity of Data Reviewed Labs: ordered.   Patient presents to the emergency department for evaluation of pelvic pain when she urinates.  She otherwise is comfortable.  Patient reports that she is somewhere around [redacted] weeks pregnant.  She does state that she was seen at a clinic, had an ultrasound and was told that everything was okay with the baby.  Based on this information, there is no concern for ectopic pregnancy.  Pelvic exam was unremarkable.  No cervical motion tenderness.  Patient is Rh+ from prior records.  Wet prep positive for yeast and clue cells.  Patient reports that this is similar to what she has had with vaginosis in the past.  Urinalysis does not suggest obvious infection.  Patient reports that she plans to terminate the pregnancy.  Nonetheless, will provide pregnancy safe medications  for treatment of bacterial vaginosis and yeast vaginitis.  Return to the emergency department for heavy vaginal bleeding or worsening pain.  Final Clinical Impression(s) / ED Diagnoses Final diagnoses:  Pelvic pain in pregnancy  BV (bacterial vaginosis)  Yeast infection of the vagina    Rx / DC Orders ED Discharge Orders          Ordered    clotrimazole (GYNE-LOTRIMIN) 1 % vaginal cream  Daily at bedtime        02/02/22 0517    metroNIDAZOLE (FLAGYL) 500 MG tablet  2 times daily        02/02/22 0517              Orpah Greek, MD 02/02/22 [phone]

## 2022-02-02 NOTE — ED Triage Notes (Addendum)
Pt states that she is [redacted] weeks pregnant (Last period November 20). Pt is complaining of lower abdominal pain when urinating. Pt currently eating a burger in triage room.

## 2022-02-05 ENCOUNTER — Telehealth: Payer: Self-pay | Admitting: *Deleted

## 2022-02-05 ENCOUNTER — Telehealth (HOSPITAL_COMMUNITY): Payer: Self-pay | Admitting: Emergency Medicine

## 2022-02-05 MED ORDER — METRONIDAZOLE 500 MG PO TABS: 500.0000 mg | ORAL_TABLET | Freq: Two times a day (BID) | ORAL | 0 refills | Status: DC

## 2022-02-05 MED ORDER — CLOTRIMAZOLE 1 % VA CREA: 1.0000 | TOPICAL_CREAM | Freq: Every day | VAGINAL | 0 refills | Status: AC

## 2022-02-05 NOTE — Patient Outreach (Signed)
  Care Coordination [facility] Note Transition Care Management Unsuccessful Follow-up Telephone Call  Date of discharge and from where:  02/02/22 from [facility]  Attempts:  1st Attempt  Reason for unsuccessful TCM follow-up call:  Left voice message   Lurena Joiner RN, [place] RN Care Coordinator

## 2022-02-05 NOTE — Telephone Encounter (Signed)
Patient called saying that her prescriptions were not available.  Patient was sent in Flagyl and clotrimazole vaginal cream however these are not at [facility].  I called to confirm with the [place] [facility] and they do not have these prescriptions available.  They report that they did have some downtime with your electronic medical system however encouraged me to resend these.  Patient aware.

## 2022-02-15 DIAGNOSIS — Z419 Encounter for procedure for purposes other than remedying health state, unspecified: Secondary | ICD-10-CM | POA: Diagnosis not present

## 2022-03-16 DIAGNOSIS — Z419 Encounter for procedure for purposes other than remedying health state, unspecified: Secondary | ICD-10-CM | POA: Diagnosis not present

## 2022-03-23 ENCOUNTER — Emergency Department (HOSPITAL_COMMUNITY): Payer: BC Managed Care – PPO

## 2022-03-23 ENCOUNTER — Emergency Department (HOSPITAL_COMMUNITY)
Admission: EM | Admit: 2022-03-23 | Discharge: 2022-03-23 | Disposition: A | Discharge status: Home / Self Care | Payer: BC Managed Care – PPO | Attending: Emergency Medicine | Admitting: Emergency Medicine

## 2022-03-23 ENCOUNTER — Other Ambulatory Visit: Payer: Self-pay

## 2022-03-23 DIAGNOSIS — N898 Other specified noninflammatory disorders of vagina: Secondary | ICD-10-CM

## 2022-03-23 DIAGNOSIS — N760 Acute vaginitis: Secondary | ICD-10-CM | POA: Insufficient documentation

## 2022-03-23 DIAGNOSIS — N939 Abnormal uterine and vaginal bleeding, unspecified: Secondary | ICD-10-CM | POA: Diagnosis present

## 2022-03-23 DIAGNOSIS — A599 Trichomoniasis, unspecified: Secondary | ICD-10-CM | POA: Diagnosis not present

## 2022-03-23 DIAGNOSIS — B9689 Other specified bacterial agents as the cause of diseases classified elsewhere: Secondary | ICD-10-CM | POA: Insufficient documentation

## 2022-03-23 LAB — CBC WITH DIFFERENTIAL/PLATELET
Abs Immature Granulocytes: 0.02 10*3/uL (ref 0.00–0.07)
Basophils Absolute: 0 10*3/uL (ref 0.0–0.1)
Basophils Relative: 0 %
Eosinophils Absolute: 0.1 10*3/uL (ref 0.0–0.5)
Eosinophils Relative: 1 %
HCT: 37.5 % (ref 36.0–46.0)
Hemoglobin: 11.9 g/dL — ABNORMAL LOW (ref 12.0–15.0)
Immature Granulocytes: 0 %
Lymphocytes Relative: 38 %
Lymphs Abs: 3.5 10*3/uL (ref 0.7–4.0)
MCH: 28 pg (ref 26.0–34.0)
MCHC: 31.7 g/dL (ref 30.0–36.0)
MCV: 88.2 fL (ref 80.0–100.0)
Monocytes Absolute: 0.7 10*3/uL (ref 0.1–1.0)
Monocytes Relative: 7 %
Neutro Abs: 4.9 10*3/uL (ref 1.7–7.7)
Neutrophils Relative %: 54 %
Platelets: 195 10*3/uL (ref 150–400)
RBC: 4.25 MIL/uL (ref 3.87–5.11)
RDW: 13.2 % (ref 11.5–15.5)
WBC: 9.3 10*3/uL (ref 4.0–10.5)
nRBC: 0 % (ref 0.0–0.2)

## 2022-03-23 LAB — URINALYSIS, ROUTINE W REFLEX MICROSCOPIC
Bilirubin Urine: NEGATIVE
Glucose, UA: NEGATIVE mg/dL
Hgb urine dipstick: NEGATIVE
Ketones, ur: NEGATIVE mg/dL
Leukocytes,Ua: NEGATIVE
Nitrite: NEGATIVE
Protein, ur: NEGATIVE mg/dL
Specific Gravity, Urine: 1.031 — ABNORMAL HIGH (ref 1.005–1.030)
pH: 5 (ref 5.0–8.0)

## 2022-03-23 LAB — WET PREP, GENITAL
Sperm: NONE SEEN
WBC, Wet Prep HPF POC: >=10 — AB (ref <10)
Yeast Wet Prep HPF POC: NONE SEEN

## 2022-03-23 LAB — BASIC METABOLIC PANEL
Anion gap: 6 (ref 5–15)
BUN: 14 mg/dL (ref 6–20)
CO2: 22 mmol/L (ref 22–32)
Calcium: 8.8 mg/dL — ABNORMAL LOW (ref 8.9–10.3)
Chloride: 109 mmol/L (ref 98–111)
Creatinine, Ser: 0.73 mg/dL (ref 0.44–1.00)
GFR, Estimated: >60 mL/min (ref >60)
Glucose, Bld: 103 mg/dL — ABNORMAL HIGH (ref 70–99)
Potassium: 3.5 mmol/L (ref 3.5–5.1)
Sodium: 137 mmol/L (ref 135–145)

## 2022-03-23 LAB — I-STAT BETA HCG BLOOD, ED (MC, WL, AP ONLY): I-stat hCG, quantitative: <5 m[IU]/mL (ref <5)

## 2022-03-23 LAB — GC/CHLAMYDIA PROBE AMP (~~LOC~~) NOT AT ARMC
Chlamydia: NEGATIVE
Comment: NEGATIVE
Comment: NORMAL
Neisseria Gonorrhea: NEGATIVE

## 2022-03-23 LAB — HIV ANTIBODY (ROUTINE TESTING W REFLEX): HIV Screen 4th Generation wRfx: NONREACTIVE

## 2022-03-23 MED ORDER — DOXYCYCLINE HYCLATE 100 MG PO TABS
100.0000 mg | ORAL_TABLET | Freq: Once | ORAL | Status: AC
  Administered 2022-03-23: 100 mg via ORAL
  Filled 2022-03-23: qty 1

## 2022-03-23 MED ORDER — METRONIDAZOLE 500 MG PO TABS: 500.0000 mg | ORAL_TABLET | Freq: Two times a day (BID) | ORAL | 0 refills | Status: DC

## 2022-03-23 MED ORDER — STERILE WATER FOR INJECTION IJ SOLN
INTRAMUSCULAR | Status: AC
  Administered 2022-03-23: 10 mL
  Filled 2022-03-23: qty 10

## 2022-03-23 MED ORDER — DOXYCYCLINE HYCLATE 100 MG PO CAPS: 100.0000 mg | ORAL_CAPSULE | Freq: Two times a day (BID) | ORAL | 0 refills | Status: DC

## 2022-03-23 MED ORDER — FLUCONAZOLE 150 MG PO TABS: ORAL_TABLET | ORAL | 0 refills | Status: DC

## 2022-03-23 MED ORDER — CEFTRIAXONE SODIUM 1 G IJ SOLR
500.0000 mg | Freq: Once | INTRAMUSCULAR | Status: AC
  Administered 2022-03-23: 500 mg via INTRAMUSCULAR
  Filled 2022-03-23: qty 10

## 2022-03-23 MED ORDER — METRONIDAZOLE 500 MG PO TABS
500.0000 mg | ORAL_TABLET | Freq: Once | ORAL | Status: AC
  Administered 2022-03-23: 500 mg via ORAL
  Filled 2022-03-23: qty 1

## 2022-03-23 NOTE — ED Triage Notes (Signed)
C/o intermittent light vaginal bleeding and lower abd pain since abortion 1 month ago.  Denies urinary complaints.  Pt requesting STD testing.  Pt reports vaginal itching.  Denies discharge.  Unprotected sex 2 days ago.

## 2022-03-23 NOTE — ED Provider Notes (Signed)
[facility] Emergency Department Provider Note MRN:  [id number]  Arrival date & time: 03/23/22     Chief Complaint   Abdominal Pain   History of Present Illness   Kelly Meadows is a 22 y.o. year-old female presents to the ED with chief complaint of vaginal bleeding and itching.  She states that she recently had an abortion and has had some bleeding that has persisted.  She states that she thought it might have been a period.  She also reports vaginal itching.  History provided by patient.   Review of Systems  Pertinent positive and negative review of systems noted in HPI.    Physical Exam   Vitals:   03/23/22 0319  BP: 118/75  Pulse: 76  Resp: 17  Temp: 98.2 F (36.8 C)  SpO2: 100%    CONSTITUTIONAL:  well-appearing, NAD NEURO:  Alert and oriented x 3, CN 3-12 grossly intact EYES:  eyes equal and reactive ENT/NECK:  Supple, no stridor  CARDIO:  normal rate, regular rhythm, appears well-perfused  PULM:  No respiratory distress, CTAB GI/GU:  non-distended, pelvic exam with mild discharge and mild old blood, os appears closed, no visualized retained products.  Female RN chaperone present MSK/SPINE:  No gross deformities, no edema, moves all extremities  SKIN:  no rash, atraumatic   *Additional and/or pertinent findings included in MDM below  Diagnostic and Interventional Summary    EKG Interpretation  Date/Time:    Ventricular Rate:    PR Interval:    QRS Duration:   QT Interval:    QTC Calculation:   R Axis:     Text Interpretation:         Labs Reviewed  WET PREP, GENITAL - Abnormal; Notable for the following components:      Result Value   Trich, Wet Prep PRESENT (*)    Clue Cells Wet Prep HPF POC PRESENT (*)    WBC, Wet Prep HPF POC >=10 (*)    All other components within normal limits  URINALYSIS, ROUTINE W REFLEX MICROSCOPIC - Abnormal; Notable for the following components:   Specific Gravity, Urine 1.031 (*)    All  other components within normal limits  CBC WITH DIFFERENTIAL/PLATELET - Abnormal; Notable for the following components:   Hemoglobin 11.9 (*)    All other components within normal limits  BASIC METABOLIC PANEL - Abnormal; Notable for the following components:   Glucose, Bld 103 (*)    Calcium 8.8 (*)    All other components within normal limits  HIV ANTIBODY (ROUTINE TESTING W REFLEX)  I-STAT BETA HCG BLOOD, ED ([facility], [facility], [facility] ONLY)  GC/CHLAMYDIA PROBE AMP ([place]) NOT AT [facility]    US Pelvis Complete  Final Result      Medications  cefTRIAXone (ROCEPHIN) injection 500 mg (has no administration in time range)  doxycycline (VIBRA-TABS) tablet 100 mg (has no administration in time range)  metroNIDAZOLE (FLAGYL) tablet 500 mg (has no administration in time range)  sterile water (preservative free) injection (has no administration in time range)     Procedures  /  Critical Care Procedures  ED Course and Medical Decision Making  I have reviewed the triage vital signs, the nursing notes, and pertinent available records from the EMR.  Social Determinants Affecting Complexity of Care: Patient has no clinically significant social determinants affecting this chief complaint..   ED Course:    Medical Decision Making Patient here with vaginal discharge.  Will treat trichomonas and BV.  Diflucan after antibiotics.    Follow-up with PCP or OBGYN.  No focal abdominal pain.  Amount and/or Complexity of Data Reviewed Labs: ordered.    Details: +trichomonas  +clue cells G/C pending HCG negative Radiology: ordered.  Risk Prescription drug management.     Consultants: No consultations were needed in caring for this patient.   Treatment and Plan: Emergency department workup does not suggest an emergent condition requiring admission or immediate intervention beyond  what has been performed at this time. The patient is safe for discharge and has  been instructed to return  immediately for worsening symptoms, change in  symptoms or any other concerns    Final Clinical Impressions(s) / ED Diagnoses     ICD-10-CM   1. Vaginal discharge  N89.8     2. Trichomonas infection  A59.9     3. Bacterial vaginosis  N76.0    B96.89       ED Discharge Orders          Ordered    metroNIDAZOLE (FLAGYL) 500 MG tablet  2 times daily        03/23/22 0529    doxycycline (VIBRAMYCIN) 100 MG capsule  2 times daily        03/23/22 0529    fluconazole (DIFLUCAN) 150 MG tablet        03/23/22 [id number]              Discharge Instructions Discussed with and Provided to Patient:   Discharge Instructions   None      Montine Circle, PA-C 03/23/22 Welcome, [place], DO 03/23/22 [phone]

## 2022-04-16 DIAGNOSIS — Z419 Encounter for procedure for purposes other than remedying health state, unspecified: Secondary | ICD-10-CM | POA: Diagnosis not present

## 2022-04-18 DIAGNOSIS — Z113 Encounter for screening for infections with a predominantly sexual mode of transmission: Secondary | ICD-10-CM | POA: Diagnosis not present

## 2022-04-18 DIAGNOSIS — N898 Other specified noninflammatory disorders of vagina: Secondary | ICD-10-CM | POA: Diagnosis not present

## 2022-05-05 ENCOUNTER — Telehealth: Payer: Self-pay | Admitting: *Deleted

## 2022-05-05 ENCOUNTER — Ambulatory Visit
Admission: EM | Admit: 2022-05-05 | Discharge: 2022-05-05 | Disposition: A | Discharge status: Home / Self Care | Payer: BC Managed Care – PPO | Attending: Internal Medicine | Admitting: Internal Medicine

## 2022-05-05 DIAGNOSIS — N760 Acute vaginitis: Secondary | ICD-10-CM | POA: Diagnosis present

## 2022-05-05 LAB — POCT URINALYSIS DIP (MANUAL ENTRY)
Bilirubin, UA: NEGATIVE
Blood, UA: NEGATIVE
Glucose, UA: NEGATIVE mg/dL
Ketones, POC UA: NEGATIVE mg/dL
Leukocytes, UA: NEGATIVE
Nitrite, UA: NEGATIVE
Protein Ur, POC: NEGATIVE mg/dL
Spec Grav, UA: >=1.03 — AB (ref 1.010–1.025)
Urobilinogen, UA: 1 E.U./dL
pH, UA: 6.5 (ref 5.0–8.0)

## 2022-05-05 MED ORDER — METRONIDAZOLE 500 MG PO TABS: 500.0000 mg | ORAL_TABLET | Freq: Two times a day (BID) | ORAL | 0 refills | Status: DC

## 2022-05-05 MED ORDER — BORIC ACID VAGINAL 600 MG VA SUPP: 1.0000 | Freq: Every evening | VAGINAL | 0 refills | Status: DC

## 2022-05-05 NOTE — ED Triage Notes (Signed)
Pt presents for STD testing.  Reports vaginal odor, lower back pain X2 weeks. States she was treated recently with flagy and doxycycline. Pt states she completed her treatment and then experienced sxs again. Pt states her partner did not complete their treatment.

## 2022-05-05 NOTE — Discharge Instructions (Addendum)
Please take medications as prescribed Your urine test is negative for urinary tract infection Please use mild soap. We will call you with recommendations if labs are abnormal.

## 2022-05-05 NOTE — ED Provider Notes (Addendum)
[facility]    CSN: [id number] Arrival date & time: 05/05/22  6295      History   Chief Complaint Chief Complaint  Patient presents with   SEXUALLY TRANSMITTED DISEASE    HPI Kelly Meadows is a 22 y.o. female was to urgent care with complaints of vaginal discharge of 2 weeks duration.  Patient describes the discharge as pain and associated with the vaginal odor.  She denies any vaginal itching.  No dysuria urgency or frequency.  Patient has a history of recurrent bacterial vaginitis.  Patient was seen in the urgent care a few weeks ago and was diagnosed with trichomonas, bacterial vaginosis and vaginal yeast.  She completed the course of her antibiotics and antifungal medication.  Symptoms improved and subsequently worsened.  Her sexual partner apparently did not complete the course of his antibiotics.  They have been sexually involved since she completed her antibiotics.  Patient denies douching.  No abdominal pain.  No deep dyspareunia.  Last menstrual period was early this month.   HPI  Past Medical History:  Diagnosis Date   Alpha thalassemia silent carrier 11/22/2020    Needs FOB testing and genetic counseling   GBS (group B Streptococcus carrier), +RV culture, currently pregnant 04/29/2021   Medical history non-contributory    Trichomoniasis     There are no problems to display for this patient.   Past Surgical History:  Procedure Laterality Date   NO PAST SURGERIES      OB History     Gravida  1   Para  1   Term  1   Preterm      AB      Living  1      SAB      IAB      Ectopic      Multiple  0   Live Births  1            Home Medications    Prior to Admission medications   Medication Sig Start Date End Date Taking? Authorizing Provider  Boric Acid Vaginal 600 MG SUPP Place 1 suppository vaginally at bedtime. 05/05/22   Lizbeth Feijoo, Britta Mccreedy, MD  metroNIDAZOLE (FLAGYL) 500 MG tablet Take 1 tablet (500 mg total) by mouth 2  (two) times daily. 05/05/22   LampteyBritta Mccreedy, MD  Norethindrone Acetate-Ethinyl Estrad-FE (LOESTRIN 24 FE) 1-20 MG-MCG(24) tablet Take 1 tablet by mouth daily. 07/02/21   Wadena Bing, MD  prenatal vitamin w/FE, FA (PRENATAL 1 + 1) 27-1 MG TABS tablet Take 1 tablet by mouth daily at 12 noon.    [provider]    Family History Family History  Problem Relation Age of Onset   Breast cancer Mother    Vision loss Father     Social History Social History   Tobacco Use   Smoking status: Never   Smokeless tobacco: Never  Vaping Use   Vaping Use: Former   Start date: 08/23/2020  Substance Use Topics   Alcohol use: Yes    Alcohol/week: 1.0 standard drink of alcohol    Types: 1 Standard drinks or equivalent per week    Comment: occasional   Drug use: Not Currently    Types: Marijuana    Comment: Stopped approx 1-2 months ago (03/31/20)     Allergies   Patient has no known allergies.   Review of Systems Review of Systems As per HPI  Physical Exam Triage Vital Signs ED Triage Vitals  Enc Vitals Group     BP 05/05/22 0822 128/79     Pulse Rate 05/05/22 0822 79     Resp 05/05/22 0822 19     Temp 05/05/22 0822 98.4 F (36.9 C)     Temp Source 05/05/22 0822 Oral     SpO2 05/05/22 0822 96 %     Weight --      Height --      Head Circumference --      Peak Flow --      Pain Score 05/05/22 0821 2     Pain Loc --      Pain Edu? --      Excl. in GC? --    No data found.  Updated Vital Signs BP 128/79 (BP Location: Left Arm)   Pulse 79   Temp 98.4 F (36.9 C) (Oral)   Resp 19   LMP 04/24/2022 (Exact Date)   SpO2 96%   Breastfeeding No   Visual Acuity Right Eye Distance:   Left Eye Distance:   Bilateral Distance:    Right Eye Near:   Left Eye Near:    Bilateral Near:     Physical Exam Vitals and nursing note reviewed.  Constitutional:      General: She is not in acute distress.    Appearance: She is not ill-appearing.  Cardiovascular:      Rate and Rhythm: Normal rate and regular rhythm.     Pulses: Normal pulses.     Heart sounds: Normal heart sounds.  Pulmonary:     Effort: Pulmonary effort is normal.     Breath sounds: Normal breath sounds.  Abdominal:     General: Bowel sounds are normal.     Palpations: Abdomen is soft.  Neurological:     Mental Status: She is alert.      UC Treatments / Results  Labs (all labs ordered are listed, but only abnormal results are displayed) Labs Reviewed  POCT URINALYSIS DIP (MANUAL ENTRY) - Abnormal; Notable for the following components:      Result Value   Spec Grav, UA >=1.030 (*)    All other components within normal limits  CERVICOVAGINAL ANCILLARY ONLY    EKG   Radiology No results found.  Procedures Procedures (including critical care time)  Medications Ordered in UC Medications - No data to display  Initial Impression / Assessment and Plan / UC Course  I have reviewed the triage vital signs and the nursing notes.  Pertinent labs & imaging results that were available during my care of the patient were reviewed by me and considered in my medical decision making (see chart for details).     1.  Acute vaginitis: Point-of-care urinalysis is negative for UTI Cervicovaginal swab for GC/chlamydia/trichomonas/bacterial vaginosis/vaginal yeast Metronidazole 500 mg twice daily for 7 days Boric acid 600 mg vaginal suppository nightly for 14 days-patient has had recurrent bacterial vaginosis Return precautions given Safe sex practices advised.  Patient advised to abstain from sexual intercourse until at least lab results are available. Final Clinical Impressions(s) / UC Diagnoses   Final diagnoses:  Acute vaginitis     Discharge Instructions      Please take medications as prescribed Your urine test is negative for urinary tract infection Please use mild soap. We will call you with recommendations if labs are abnormal.   ED Prescriptions     Medication  Sig Dispense Auth. Provider   metroNIDAZOLE (FLAGYL) 500 MG tablet Take 1 tablet (500 mg total)  by mouth 2 (two) times daily. 14 tablet Donyelle Enyeart, Britta Mccreedy, MD   Boric Acid Vaginal 600 MG SUPP Place 1 suppository vaginally at bedtime. 14 suppository Nicha Hemann, Britta Mccreedy, MD      PDMP not reviewed this encounter.   Merrilee Jansky, MD 05/05/22 1660    Merrilee Jansky, MD 05/05/22 [phone]

## 2022-05-07 ENCOUNTER — Telehealth: Payer: Self-pay

## 2022-05-07 LAB — CERVICOVAGINAL ANCILLARY ONLY
Bacterial Vaginitis (gardnerella): POSITIVE — AB
Candida Glabrata: NEGATIVE
Candida Vaginitis: NEGATIVE
Chlamydia: NEGATIVE
Comment: NEGATIVE
Comment: NEGATIVE
Comment: NEGATIVE
Comment: NEGATIVE
Comment: NEGATIVE
Comment: NORMAL
Neisseria Gonorrhea: NEGATIVE
Trichomonas: NEGATIVE

## 2022-05-07 MED ORDER — BORIC ACID VAGINAL 600 MG VA SUPP: 1.0000 | Freq: Every evening | VAGINAL | 0 refills | Status: DC

## 2022-05-07 NOTE — Telephone Encounter (Signed)
Patient called stating her pharmacy didn't carry her medications and needed the script changed to [facility] on [address]. Scrip has been sent in

## 2022-05-16 DIAGNOSIS — Z419 Encounter for procedure for purposes other than remedying health state, unspecified: Secondary | ICD-10-CM | POA: Diagnosis not present

## 2022-06-16 DIAGNOSIS — Z419 Encounter for procedure for purposes other than remedying health state, unspecified: Secondary | ICD-10-CM | POA: Diagnosis not present

## 2022-07-10 ENCOUNTER — Encounter (HOSPITAL_COMMUNITY): Payer: Self-pay | Admitting: *Deleted

## 2022-07-10 ENCOUNTER — Inpatient Hospital Stay (HOSPITAL_COMMUNITY): Payer: BC Managed Care – PPO

## 2022-07-10 ENCOUNTER — Inpatient Hospital Stay (HOSPITAL_COMMUNITY)
Admission: AD | Admit: 2022-07-10 | Discharge: 2022-07-11 | Disposition: A | Discharge status: Home / Self Care | Payer: BC Managed Care – PPO | Attending: Obstetrics and Gynecology | Admitting: Obstetrics and Gynecology

## 2022-07-10 DIAGNOSIS — O99820 Streptococcus B carrier state complicating pregnancy: Secondary | ICD-10-CM | POA: Insufficient documentation

## 2022-07-10 DIAGNOSIS — M549 Dorsalgia, unspecified: Secondary | ICD-10-CM | POA: Insufficient documentation

## 2022-07-10 DIAGNOSIS — O3680X0 Pregnancy with inconclusive fetal viability, not applicable or unspecified: Secondary | ICD-10-CM | POA: Diagnosis not present

## 2022-07-10 DIAGNOSIS — O99891 Other specified diseases and conditions complicating pregnancy: Secondary | ICD-10-CM | POA: Insufficient documentation

## 2022-07-10 DIAGNOSIS — Z3A09 9 weeks gestation of pregnancy: Secondary | ICD-10-CM | POA: Diagnosis not present

## 2022-07-10 DIAGNOSIS — O26891 Other specified pregnancy related conditions, first trimester: Secondary | ICD-10-CM | POA: Diagnosis present

## 2022-07-10 DIAGNOSIS — Z87891 Personal history of nicotine dependence: Secondary | ICD-10-CM | POA: Insufficient documentation

## 2022-07-10 DIAGNOSIS — O209 Hemorrhage in early pregnancy, unspecified: Secondary | ICD-10-CM | POA: Insufficient documentation

## 2022-07-10 LAB — URINALYSIS, ROUTINE W REFLEX MICROSCOPIC
Bacteria, UA: NONE SEEN
Bilirubin Urine: NEGATIVE
Glucose, UA: NEGATIVE mg/dL
Ketones, ur: NEGATIVE mg/dL
Nitrite: NEGATIVE
Protein, ur: NEGATIVE mg/dL
Specific Gravity, Urine: 1.023 (ref 1.005–1.030)
pH: 6 (ref 5.0–8.0)

## 2022-07-10 LAB — CBC
HCT: 34.5 % — ABNORMAL LOW (ref 36.0–46.0)
Hemoglobin: 11.3 g/dL — ABNORMAL LOW (ref 12.0–15.0)
MCH: 28.7 pg (ref 26.0–34.0)
MCHC: 32.8 g/dL (ref 30.0–36.0)
MCV: 87.6 fL (ref 80.0–100.0)
Platelets: 200 10*3/uL (ref 150–400)
RBC: 3.94 MIL/uL (ref 3.87–5.11)
RDW: 13.1 % (ref 11.5–15.5)
WBC: 7.8 10*3/uL (ref 4.0–10.5)
nRBC: 0 % (ref 0.0–0.2)

## 2022-07-10 LAB — TYPE AND SCREEN
ABO/RH(D): A POS
Antibody Screen: NEGATIVE

## 2022-07-10 LAB — POCT PREGNANCY, URINE: Preg Test, Ur: POSITIVE — AB

## 2022-07-10 LAB — WET PREP, GENITAL
Sperm: NONE SEEN
Trich, Wet Prep: NONE SEEN
WBC, Wet Prep HPF POC: >=10 — AB (ref <10)
Yeast Wet Prep HPF POC: NONE SEEN

## 2022-07-10 LAB — HCG, QUANTITATIVE, PREGNANCY: hCG, Beta Chain, Quant, S: 165 m[IU]/mL — ABNORMAL HIGH (ref <5)

## 2022-07-10 NOTE — MAU Note (Signed)
Pt says she went to [facility] on this m[facility]- to confirm preg- no problems. VB- started Friday am -in underwear- red  Weekend had more VB -but now  less - on a pad  Now - in Triage - pad light red - sm amt Cramping started Fri-  Now - no cramps- but back hurts- 3/10

## 2022-07-11 ENCOUNTER — Other Ambulatory Visit: Payer: Self-pay | Admitting: Obstetrics and Gynecology

## 2022-07-11 ENCOUNTER — Encounter (HOSPITAL_COMMUNITY): Payer: Self-pay | Admitting: Obstetrics and Gynecology

## 2022-07-11 DIAGNOSIS — O3680X0 Pregnancy with inconclusive fetal viability, not applicable or unspecified: Secondary | ICD-10-CM

## 2022-07-11 DIAGNOSIS — Z3A09 9 weeks gestation of pregnancy: Secondary | ICD-10-CM

## 2022-07-11 DIAGNOSIS — O26891 Other specified pregnancy related conditions, first trimester: Secondary | ICD-10-CM | POA: Diagnosis not present

## 2022-07-11 NOTE — MAU Provider Note (Signed)
History     [id number]  Arrival date and time: 07/10/22 2117    Chief Complaint  Patient presents with   Back Pain   Vaginal Bleeding    HPI Kelly Meadows is a 22 y.o. G3P1011 at [redacted]w[redacted]d by approximate LMP presenting with vaginal bleeding & cramping in pregnancy.   Pt reports +UPT and pregnancy confirmation completed at [facility]. She has not had an ultrasound this pregnancy. She reports heavy bleeding and severe cramping started 6 days ago. Was passing large clots and possibly pregnancy tissue. Over the past couple of days her bleeding has significantly slowed down and is currently very light. No current cramping pain. No fevers or chills.   Ectopic RF: No hx ectopic. Hx trich, no PID. SVDx1 & 1 SAB. No tobacco use  --/--/A POS (06/25 2229)  Past Medical History:  Diagnosis Date   Alpha thalassemia silent carrier 11/22/2020   [ ]  Needs FOB testing and genetic counseling   GBS (group B Streptococcus carrier), +RV culture, currently pregnant 04/29/2021   Medical history non-contributory    Trichomoniasis    Past Surgical History:  Procedure Laterality Date   NO PAST SURGERIES     Family History  Problem Relation Age of Onset   Breast cancer Mother    Vision loss Father     Social History   Socioeconomic History   Marital status: Single    Spouse name: Not on file   Number of children: Not on file   Years of education: Not on file   Highest education level: Not on file  Occupational History   Not on file  Tobacco Use   Smoking status: Never   Smokeless tobacco: Never  Vaping Use   Vaping Use: Former   Start date: 08/23/2020  Substance and Sexual Activity   Alcohol use: Yes    Alcohol/week: 1.0 standard drink of alcohol    Types: 1 Standard drinks or equivalent per week    Comment: occasional   Drug use: Not Currently    Types: Marijuana    Comment: Stopped approx 1-2 months ago (03/31/20)   Sexual activity: Yes    Birth control/protection: None  Other Topics  Concern   Not on file  Social History Narrative   Not on file   Social Determinants of Health   Financial Resource Strain: Not on file  Food Insecurity: Not on file  Transportation Needs: Not on file  Physical Activity: Not on file  Stress: Not on file  Social Connections: Not on file  Intimate Partner Violence: Not on file    No Known Allergies  No current facility-administered medications on file prior to encounter.   Current Outpatient Medications on File Prior to Encounter  Medication Sig Dispense Refill   Boric Acid Vaginal 600 MG SUPP Place 1 suppository vaginally at bedtime. 14 suppository 0   metroNIDAZOLE (FLAGYL) 500 MG tablet Take 1 tablet (500 mg total) by mouth 2 (two) times daily. 14 tablet 0   Norethindrone Acetate-Ethinyl Estrad-FE (LOESTRIN 24 FE) 1-20 MG-MCG(24) tablet Take 1 tablet by mouth daily. 90 tablet 3   prenatal vitamin w/FE, FA (PRENATAL 1 + 1) 27-1 MG TABS tablet Take 1 tablet by mouth daily at 12 noon.     Pertinent positives and negative per HPI, all others reviewed and negative  Physical Exam   BP 101/63 (BP Location: Right Arm)   Pulse 86   Temp 98.5 F (36.9 C) (Oral)   Resp 12   Ht 5'  5" (1.651 m)   Wt 77.3 kg   LMP 05/07/2022   BMI 28.37 kg/m   Patient Vitals for the past 24 hrs:  BP Temp Temp src Pulse Resp Height Weight  07/10/22 2133 101/63 98.5 F (36.9 C) Oral 86 12 5\' 5"  (1.651 m) 77.3 kg   Physical Exam: Gen: A&Ox3, NAD CV: Normal rate Resp: Normal respiratory effort Abd: Soft, nontender, nondistended Pelvic: deferred  Labs Results for orders placed or performed during the hospital encounter of 07/10/22 (from the past 24 hour(s))  Urinalysis, Routine w reflex microscopic -Urine, Clean Catch     Status: Abnormal   Collection Time: 07/10/22  9:37 PM  Result Value Ref Range   Color, Urine YELLOW YELLOW   APPearance CLEAR CLEAR   Specific Gravity, Urine 1.023 1.005 - 1.030   pH 6.0 5.0 - 8.0   Glucose, UA NEGATIVE  NEGATIVE mg/dL   Hgb urine dipstick MODERATE (A) NEGATIVE   Bilirubin Urine NEGATIVE NEGATIVE   Ketones, ur NEGATIVE NEGATIVE mg/dL   Protein, ur NEGATIVE NEGATIVE mg/dL   Nitrite NEGATIVE NEGATIVE   Leukocytes,Ua TRACE (A) NEGATIVE   RBC / HPF 0-5 0 - 5 RBC/hpf   WBC, UA 0-5 0 - 5 WBC/hpf   Bacteria, UA NONE SEEN NONE SEEN   Squamous Epithelial / HPF 0-5 0 - 5 /HPF   Mucus PRESENT   Wet prep, genital     Status: Abnormal   Collection Time: 07/10/22  9:39 PM  Result Value Ref Range   Yeast Wet Prep HPF POC NONE SEEN NONE SEEN   Trich, Wet Prep NONE SEEN NONE SEEN   Clue Cells Wet Prep HPF POC PRESENT (A) NONE SEEN   WBC, Wet Prep HPF POC >=10 (A) <10   Sperm NONE SEEN   Pregnancy, urine POC     Status: Abnormal   Collection Time: 07/10/22  9:44 PM  Result Value Ref Range   Preg Test, Ur POSITIVE (A) NEGATIVE  Type and screen [facility]     Status: None   Collection Time: 07/10/22 10:29 PM  Result Value Ref Range   ABO/RH(D) A POS    Antibody Screen NEG    Sample Expiration      07/13/2022,2359 Performed at [facility], [address], [place], [place] [address]   CBC     Status: Abnormal   Collection Time: 07/10/22 10:34 PM  Result Value Ref Range   WBC 7.8 4.0 - 10.5 K/uL   RBC 3.94 3.87 - 5.11 MIL/uL   Hemoglobin 11.3 (L) 12.0 - 15.0 g/dL   HCT 19.1 (L) 47.8 - 29.5 %   MCV 87.6 80.0 - 100.0 fL   MCH 28.7 26.0 - 34.0 pg   MCHC 32.8 30.0 - 36.0 g/dL   RDW 62.1 30.8 - 65.7 %   Platelets 200 150 - 400 K/uL   nRBC 0.0 0.0 - 0.2 %  hCG, quantitative, pregnancy     Status: Abnormal   Collection Time: 07/10/22 10:34 PM  Result Value Ref Range   hCG, Beta Chain, Quant, S 165 (H) <5 mIU/mL   Imaging US OB LESS THAN 14 WEEKS WITH OB TRANSVAGINAL  Result Date: 07/10/2022 CLINICAL DATA:  Pregnancy of unknown location. LMP: 05/07/2022 corresponding to an estimated gestational age of [redacted] weeks, 1 day. EXAM: OBSTETRIC <14 WK [place] AND TRANSVAGINAL OB US  TECHNIQUE: Both transabdominal and transvaginal ultrasound examinations were performed for complete evaluation of the gestation as well as the  maternal uterus, adnexal regions, and pelvic cul-de-sac. Transvaginal technique was performed to assess early pregnancy. COMPARISON:  None Available. FINDINGS: The uterus is anteverted and appears unremarkable. The endometrium measures 1 cm in thickness and appears unremarkable. No intrauterine pregnancy identified. There is a 4.8 x 4.4 x 4.7 cm complex cystic lesion in the right ovary (previously 4.0 x 2.8 x 3.5 cm). There is layering echogenic content within this cyst likely represents blood product/intracystic hemorrhage and new since the prior ultrasound of 03/23/2022. The left ovary is unremarkable. IMPRESSION: 1. No intrauterine pregnancy identified and no suspicious adnexal masses noted. Findings consistent with pregnancy of unknown location and differential diagnosis includes: An early IUP, recent spontaneous miscarriage, or an occult ectopic pregnancy. Clinical correlation and follow-up with serial HCG levels and repeat ultrasound in 7-11 days, or earlier if clinically indicated, recommended. 2. Interval increase in the size of the previously seen right ovarian cyst with development of internal hemorrhage. Electronically Signed   By: Elgie Collard M.D.   On: 07/10/2022 23:49    MAU Course  Procedures  Lab Orders         Wet prep, genital         Urinalysis, Routine w reflex microscopic -Urine, Clean Catch         CBC         hCG, quantitative, pregnancy         Pregnancy, urine POC    No orders of the defined types were placed in this encounter.   Imaging Orders         US OB LESS THAN 14 WEEKS WITH OB TRANSVAGINAL     MDM moderate  Assessment and Plan  21yo 03/1009 at [redacted]w[redacted]d by approx LMP with pregnancy of unknown location, suspected complete SAB  - Suspect complete SAB, but given no prior confirmation of IUP will follow up with BhCG in 48  hours.  - Message routed to [facility] office to arrange outpatient BhCG - Instructed to return sooner for HVB, severe uncontrolled pain, fevers/chills  Discharge Instructions     Activity as tolerated - No restrictions   Complete by: As directed    Call MD for:   Complete by: As directed    Heavy vaginal bleeding (more than 1 pad per hour) or severe pain   Call MD for:  temperature >100.4   Complete by: As directed    Diet - low sodium heart healthy   Complete by: As directed        Lennart Pall, MD 07/11/22 12:40 AM  Allergies as of 07/11/2022   No Known Allergies      Medication List     STOP taking these medications    Boric Acid Vaginal 600 MG Supp   metroNIDAZOLE 500 MG tablet Commonly known as: FLAGYL       TAKE these medications    Norethindrone Acetate-Ethinyl Estrad-FE 1-20 MG-MCG(24) tablet Commonly known as: LOESTRIN 24 FE Take 1 tablet by mouth daily.   prenatal vitamin w/FE, FA 27-1 MG Tabs tablet Take 1 tablet by mouth daily at 12 noon.

## 2022-07-12 ENCOUNTER — Other Ambulatory Visit: Payer: BC Managed Care – PPO

## 2022-07-13 LAB — GC/CHLAMYDIA PROBE AMP (~~LOC~~) NOT AT ARMC
Chlamydia: NEGATIVE
Comment: NEGATIVE
Comment: NORMAL
Neisseria Gonorrhea: NEGATIVE

## 2022-07-16 DIAGNOSIS — Z419 Encounter for procedure for purposes other than remedying health state, unspecified: Secondary | ICD-10-CM | POA: Diagnosis not present

## 2022-08-04 ENCOUNTER — Encounter (HOSPITAL_COMMUNITY): Payer: Self-pay

## 2022-08-04 ENCOUNTER — Other Ambulatory Visit: Payer: Self-pay

## 2022-08-04 ENCOUNTER — Emergency Department (HOSPITAL_COMMUNITY)
Admission: EM | Admit: 2022-08-04 | Discharge: 2022-08-04 | Disposition: A | Discharge status: Home / Self Care | Payer: BC Managed Care – PPO | Attending: Emergency Medicine | Admitting: Emergency Medicine

## 2022-08-04 DIAGNOSIS — Z202 Contact with and (suspected) exposure to infections with a predominantly sexual mode of transmission: Secondary | ICD-10-CM | POA: Diagnosis not present

## 2022-08-04 DIAGNOSIS — N898 Other specified noninflammatory disorders of vagina: Secondary | ICD-10-CM | POA: Diagnosis not present

## 2022-08-04 DIAGNOSIS — Z711 Person with feared health complaint in whom no diagnosis is made: Secondary | ICD-10-CM

## 2022-08-04 LAB — WET PREP, GENITAL
Clue Cells Wet Prep HPF POC: NONE SEEN
Sperm: NONE SEEN
Trich, Wet Prep: NONE SEEN
WBC, Wet Prep HPF POC: <10 (ref <10)
Yeast Wet Prep HPF POC: NONE SEEN

## 2022-08-04 LAB — PREGNANCY, URINE: Preg Test, Ur: NEGATIVE

## 2022-08-04 LAB — URINALYSIS, ROUTINE W REFLEX MICROSCOPIC
Bilirubin Urine: NEGATIVE
Glucose, UA: NEGATIVE mg/dL
Hgb urine dipstick: NEGATIVE
Ketones, ur: NEGATIVE mg/dL
Leukocytes,Ua: NEGATIVE
Nitrite: NEGATIVE
Protein, ur: NEGATIVE mg/dL
Specific Gravity, Urine: 1.027 (ref 1.005–1.030)
pH: 6 (ref 5.0–8.0)

## 2022-08-04 MED ORDER — CEFTRIAXONE SODIUM 1 G IJ SOLR
500.0000 mg | Freq: Once | INTRAMUSCULAR | Status: DC
  Filled 2022-08-04: qty 10

## 2022-08-04 MED ORDER — ACETAMINOPHEN 500 MG PO TABS
1000.0000 mg | ORAL_TABLET | Freq: Once | ORAL | Status: DC
  Filled 2022-08-04: qty 2

## 2022-08-04 MED ORDER — AZITHROMYCIN 250 MG PO TABS
1000.0000 mg | ORAL_TABLET | Freq: Once | ORAL | Status: AC
  Administered 2022-08-04: 1000 mg via ORAL
  Filled 2022-08-04: qty 4

## 2022-08-04 MED ORDER — LIDOCAINE HCL 1 % IJ SOLN
INTRAMUSCULAR | Status: AC
  Filled 2022-08-04: qty 20

## 2022-08-04 NOTE — Discharge Instructions (Signed)
You were seen in the ER for vaginal discharge.  Your testing for yeast, bacterial vaginosis, and trichomonas are negative. Your urine was negative for UTI. Your pregnancy test was negative.   The tests that have not resulted yet are your gonorrhea and chlamydia testing. You can sign up for [facility] MyChart to access your medical records and test results using this link: [email or web address]  We have given you antibiotics for both gonorrhea and chlamydia.  If your tests result negative and you are still having symptoms, I recommend being reevaluated.   Continue to monitor how you're doing and return to the ER for new or worsening symptoms such as fever, abdominal pain, abnormal vaginal bleeding.

## 2022-08-04 NOTE — ED Provider Notes (Cosign Needed Addendum)
[place] EMERGENCY DEPARTMENT AT [facility] Provider Note   CSN: [id number] Arrival date & time: 08/04/22  9528     History  Chief Complaint  Patient presents with   Vaginal Discharge    Kelly Meadows is a 22 y.o. female who presents to the ER complaining of vaginal discharge since yesterday. Describes it as thick, white, and foul smelling. Reporting some dysuria. Wants STD testing.    Vaginal Discharge      Home Medications Prior to Admission medications   Medication Sig Start Date End Date Taking? Authorizing Provider  Norethindrone Acetate-Ethinyl Estrad-FE (LOESTRIN 24 FE) 1-20 MG-MCG(24) tablet Take 1 tablet by mouth daily. 07/02/21   [place] Bing, MD  prenatal vitamin w/FE, FA (PRENATAL 1 + 1) 27-1 MG TABS tablet Take 1 tablet by mouth daily at 12 noon.    [provider]      Allergies    Patient has no known allergies.    Review of Systems   Review of Systems  Genitourinary:  Positive for vaginal discharge.  All other systems reviewed and are negative.   Physical Exam Updated Vital Signs BP 125/79 (BP Location: Right Arm)   Pulse (!) 103   Temp 98.2 F (36.8 C) (Oral)   Resp 18   Ht 5\' 5"  (1.651 m)   Wt 77.1 kg   LMP 05/07/2022   SpO2 100%   Breastfeeding Unknown   BMI 28.29 kg/m  Physical Exam Vitals and nursing note reviewed.  Constitutional:      Appearance: Normal appearance.  HENT:     Head: Normocephalic and atraumatic.  Eyes:     Conjunctiva/sclera: Conjunctivae normal.  Pulmonary:     Effort: Pulmonary effort is normal. No respiratory distress.  Skin:    General: Skin is warm and dry.  Neurological:     Mental Status: She is alert.  Psychiatric:        Mood and Affect: Mood normal.        Behavior: Behavior normal.     ED Results / Procedures / Treatments   Labs (all labs ordered are listed, but only abnormal results are displayed) Labs Reviewed  URINALYSIS, ROUTINE W REFLEX MICROSCOPIC -  Abnormal; Notable for the following components:      Result Value   APPearance HAZY (*)    All other components within normal limits  WET PREP, GENITAL  PREGNANCY, URINE  GC/CHLAMYDIA PROBE AMP (Leslie) NOT AT [facility]    EKG None  Radiology No results found.  Procedures Procedures    Medications Ordered in ED Medications  cefTRIAXone (ROCEPHIN) injection 500 mg (has no administration in time range)  azithromycin (ZITHROMAX) tablet 1,000 mg (has no administration in time range)    ED Course/ Medical Decision Making/ A&P                             Medical Decision Making Amount and/or Complexity of Data Reviewed Labs: ordered.  Risk OTC drugs. Prescription drug management.   This patient is a 22 y.o. female  who presents to the ED for concern of vaginal discharge.   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 The pertinent findings include: Normal vitals, non distress. Resting comfortably. No pain.   Lab Tests/Imaging studies: I personally interpreted labs/imaging and the pertinent results include:  UA negative for UTI, pregnancy negative. Wet prep negative. G/C testing  pending.   Medications: I ordered medication including azithromycin and rocephin for prophylaxis.  I have reviewed the patients home medicines and have made adjustments as needed.   Disposition: After consideration of the diagnostic results and the patients response to treatment, I feel that emergency department workup does not suggest an emergent condition requiring admission or immediate intervention beyond what has been performed at this time. Patient understands that they have G/C cultures pending and they will need to inform all sexual partners if results are positive. Discussed importance of using protection when sexually active. The patient is safe for discharge and has been instructed to return immediately for worsening  symptoms, change in symptoms or any other concerns.  Final Clinical Impression(s) / ED Diagnoses Final diagnoses:  Vaginal discharge  Concern about STD in female without diagnosis    Rx / DC Orders ED Discharge Orders     None      Portions of this report may have been transcribed using voice recognition software. Every effort was made to ensure accuracy; however, inadvertent computerized transcription errors may be present.    Jeanella Flattery 08/04/22 1610    Su Monks, PA-C 08/04/22 0839    Lorre Nick, MD 08/05/22 [phone]

## 2022-08-04 NOTE — ED Triage Notes (Signed)
Pt arrived via POV. C/o thick white foul smelling vaginal discharge, with some dysuria. Pt would like STI testing.  AOx4

## 2022-08-06 LAB — GC/CHLAMYDIA PROBE AMP (~~LOC~~) NOT AT ARMC
Chlamydia: NEGATIVE
Comment: NEGATIVE
Comment: NORMAL
Neisseria Gonorrhea: NEGATIVE

## 2022-08-16 DIAGNOSIS — Z419 Encounter for procedure for purposes other than remedying health state, unspecified: Secondary | ICD-10-CM | POA: Diagnosis not present

## 2022-08-26 ENCOUNTER — Encounter (HOSPITAL_BASED_OUTPATIENT_CLINIC_OR_DEPARTMENT_OTHER): Payer: Self-pay | Admitting: Emergency Medicine

## 2022-08-26 ENCOUNTER — Emergency Department (HOSPITAL_BASED_OUTPATIENT_CLINIC_OR_DEPARTMENT_OTHER)
Admission: EM | Admit: 2022-08-26 | Discharge: 2022-08-26 | Disposition: A | Discharge status: Home / Self Care | Payer: BC Managed Care – PPO | Source: Home / Self Care | Attending: Emergency Medicine | Admitting: Emergency Medicine

## 2022-08-26 ENCOUNTER — Other Ambulatory Visit: Payer: Self-pay

## 2022-08-26 DIAGNOSIS — A5901 Trichomonal vulvovaginitis: Secondary | ICD-10-CM | POA: Diagnosis not present

## 2022-08-26 DIAGNOSIS — N760 Acute vaginitis: Secondary | ICD-10-CM | POA: Insufficient documentation

## 2022-08-26 DIAGNOSIS — N898 Other specified noninflammatory disorders of vagina: Secondary | ICD-10-CM | POA: Diagnosis present

## 2022-08-26 LAB — URINALYSIS, ROUTINE W REFLEX MICROSCOPIC
Bilirubin Urine: NEGATIVE
Glucose, UA: NEGATIVE mg/dL
Hgb urine dipstick: NEGATIVE
Ketones, ur: NEGATIVE mg/dL
Leukocytes,Ua: NEGATIVE
Nitrite: NEGATIVE
Protein, ur: NEGATIVE mg/dL
Specific Gravity, Urine: 1.025 (ref 1.005–1.030)
pH: 7 (ref 5.0–8.0)

## 2022-08-26 LAB — WET PREP, GENITAL
Sperm: NONE SEEN
WBC, Wet Prep HPF POC: <10 (ref <10)
Yeast Wet Prep HPF POC: NONE SEEN

## 2022-08-26 LAB — PREGNANCY, URINE: Preg Test, Ur: NEGATIVE

## 2022-08-26 MED ORDER — DOXYCYCLINE HYCLATE 100 MG PO TABS
100.0000 mg | ORAL_TABLET | Freq: Once | ORAL | Status: AC
  Administered 2022-08-26: 100 mg via ORAL
  Filled 2022-08-26: qty 1

## 2022-08-26 MED ORDER — CEFTRIAXONE SODIUM 500 MG IJ SOLR
500.0000 mg | Freq: Once | INTRAMUSCULAR | Status: AC
  Administered 2022-08-26: 500 mg via INTRAMUSCULAR
  Filled 2022-08-26: qty 500

## 2022-08-26 MED ORDER — DOXYCYCLINE HYCLATE 100 MG PO CAPS: 100.0000 mg | ORAL_CAPSULE | Freq: Two times a day (BID) | ORAL | 0 refills | Status: DC

## 2022-08-26 MED ORDER — METRONIDAZOLE 500 MG PO TABS
2000.0000 mg | ORAL_TABLET | Freq: Once | ORAL | Status: AC
  Administered 2022-08-26: 2000 mg via ORAL
  Filled 2022-08-26: qty 4

## 2022-08-26 NOTE — ED Provider Notes (Signed)
[place] EMERGENCY DEPARTMENT AT [facility]  Provider Note  CSN: [id number] Arrival date & time: 08/26/22 6295  History Chief Complaint  Patient presents with   Vaginal Discharge    Kelly Meadows is a 22 y.o. female reports several days of vaginal itching and discharge with an odor, requesting STI check. She had similar on 7/20 with negative swabs. She had a spontaneous abortion on 6/25.    Home Medications Prior to Admission medications   Medication Sig Start Date End Date Taking? Authorizing Provider  doxycycline (VIBRAMYCIN) 100 MG capsule Take 1 capsule (100 mg total) by mouth 2 (two) times daily. 08/26/22  Yes Pollyann Savoy, MD  Norethindrone Acetate-Ethinyl Estrad-FE (LOESTRIN 24 FE) 1-20 MG-MCG(24) tablet Take 1 tablet by mouth daily. 07/02/21   [place] Bing, MD  prenatal vitamin w/FE, FA (PRENATAL 1 + 1) 27-1 MG TABS tablet Take 1 tablet by mouth daily at 12 noon.    [provider]     Allergies    Patient has no known allergies.   Review of Systems   Review of Systems Please see HPI for pertinent positives and negatives  Physical Exam BP 114/69   Pulse 70   Temp 98.5 F (36.9 C) (Oral)   Resp 15   Ht 5\' 5"  (1.651 m)   Wt 77.1 kg   LMP 07/08/2022   SpO2 100%   BMI 28.29 kg/m   Physical Exam Vitals and nursing note reviewed.  HENT:     Head: Normocephalic.     Nose: Nose normal.  Eyes:     Extraocular Movements: Extraocular movements intact.  Pulmonary:     Effort: Pulmonary effort is normal.  Musculoskeletal:        General: Normal range of motion.     Cervical back: Neck supple.  Skin:    Findings: No rash (on exposed skin).  Neurological:     Mental Status: She is alert and oriented to person, place, and time.  Psychiatric:        Mood and Affect: Mood normal.     ED Results / Procedures / Treatments   EKG None  Procedures Procedures  Medications Ordered in the ED Medications  metroNIDAZOLE  (FLAGYL) tablet 2,000 mg (has no administration in time range)  cefTRIAXone (ROCEPHIN) injection 500 mg (has no administration in time range)  doxycycline (VIBRA-TABS) tablet 100 mg (has no administration in time range)    Initial Impression and Plan  Patient here with vaginal discharge, requesting self swab.   ED Course   Clinical Course as of 08/26/22 0527  Wynelle Link Aug 26, 2022  2841 Wet Prep is positive for trichomonas. Will treat with Flagyl.  [CS]  0524 UA is clear.  [CS]  0525 HCG is neg  [CS]    Clinical Course User Index [CS] Pollyann Savoy, MD     MDM Rules/Calculators/A&P Medical Decision Making Problems Addressed: Acute vaginitis: acute illness or injury Trichomonas vaginitis: acute illness or injury  Amount and/or Complexity of Data Reviewed Labs: ordered. Decision-making details documented in ED Course.  Risk Prescription drug management.     Final Clinical Impression(s) / ED Diagnoses Final diagnoses:  Acute vaginitis  Trichomonas vaginitis    Rx / DC Orders ED Discharge Orders          Ordered    doxycycline (VIBRAMYCIN) 100 MG capsule  2 times daily        08/26/22 0526  Pollyann Savoy, MD 08/26/22 [phone]

## 2022-08-26 NOTE — ED Triage Notes (Addendum)
"  Vaginal odor and itchiness for a couple days". Pt desires STD testing. Hx BV. No confirmed exposures to STIs.

## 2022-08-31 ENCOUNTER — Telehealth (HOSPITAL_BASED_OUTPATIENT_CLINIC_OR_DEPARTMENT_OTHER): Payer: Self-pay | Admitting: Emergency Medicine

## 2022-08-31 NOTE — ED Notes (Cosign Needed)
Last telephone encounter placed in error.  Patient called into the ED with concerns for treatment for her positive trach and clue cells on her wet prep.  Consult with ED pharmacist he notes that patient should be treated with Flagyl twice daily x 7 days on top of the 2 g Flagyl that she received in the ED on 08/26/2022.  Pt will be sent Flagyl 500 mg BID x 7 days.    Kelly Meadows A, PA-C 08/31/22 1608

## 2022-08-31 NOTE — Telephone Encounter (Cosign Needed)
Patient called into the ED with concerns for treatment for her positive trich and clue on her wet prep. Consult with ED pharmacist who notes that patient should be treated with Flagyl BID x 5 days on top of the 2 g Flagyl that she received in the ED. Flagyl sent to patient pharmacy.

## 2022-09-15 ENCOUNTER — Encounter (HOSPITAL_COMMUNITY): Payer: Self-pay

## 2022-09-15 ENCOUNTER — Ambulatory Visit (HOSPITAL_COMMUNITY)
Admission: EM | Admit: 2022-09-15 | Discharge: 2022-09-15 | Disposition: A | Discharge status: Home / Self Care | Payer: BC Managed Care – PPO | Source: Home / Self Care

## 2022-09-15 DIAGNOSIS — N3001 Acute cystitis with hematuria: Secondary | ICD-10-CM | POA: Insufficient documentation

## 2022-09-15 LAB — POCT URINALYSIS DIP (MANUAL ENTRY)
Bilirubin, UA: NEGATIVE
Glucose, UA: NEGATIVE mg/dL
Leukocytes, UA: NEGATIVE
Nitrite, UA: NEGATIVE
Protein Ur, POC: NEGATIVE mg/dL
Spec Grav, UA: >=1.03 — AB (ref 1.010–1.025)
Urobilinogen, UA: 0.2 E.U./dL
pH, UA: 5.5 (ref 5.0–8.0)

## 2022-09-15 LAB — POCT URINE PREGNANCY: Preg Test, Ur: NEGATIVE

## 2022-09-15 MED ORDER — NITROFURANTOIN MONOHYD MACRO 100 MG PO CAPS: 100.0000 mg | ORAL_CAPSULE | Freq: Two times a day (BID) | ORAL | 0 refills | Status: DC

## 2022-09-15 NOTE — ED Triage Notes (Signed)
PT c/o possible UTI x2days  PT states she is having blood in her urine and frequency with abdominal pain and back pain.

## 2022-09-15 NOTE — ED Provider Notes (Signed)
[facility]    CSN: [id number] Arrival date & time: 09/15/22  1511      History   Chief Complaint Chief Complaint  Patient presents with   Urinary Tract Infection    HPI Kelly Meadows is a 22 y.o. female.   Patient presents to clinic for complaints of hematuria and lower abdominal pain for the past 2 days.  Her urine has also been pretty dark, she has been going to the gym and sweating a lot.  She denies any fevers.  She does have some lower back pain that has been ongoing.  Denies nausea or vomiting.  Denies any changes to vaginal discharge, vaginal odor or concerns for STIs.  Last menstrual cycle was August 19th.    The history is provided by the patient and medical records.  Urinary Tract Infection Associated symptoms: abdominal pain   Associated symptoms: no fever, no nausea, no vaginal discharge and no vomiting     Past Medical History:  Diagnosis Date   Alpha thalassemia silent carrier 11/22/2020   [ ]  Needs FOB testing and genetic counseling   GBS (group B Streptococcus carrier), +RV culture, currently pregnant 04/29/2021   Medical history non-contributory    Trichomoniasis     There are no problems to display for this patient.   Past Surgical History:  Procedure Laterality Date   NO PAST SURGERIES      OB History     Gravida  3   Para  1   Term  1   Preterm      AB  1   Living  1      SAB  1   IAB      Ectopic      Multiple  0   Live Births  1            Home Medications    Prior to Admission medications   Medication Sig Start Date End Date Taking? Authorizing Provider  nitrofurantoin, macrocrystal-monohydrate, (MACROBID) 100 MG capsule Take 1 capsule (100 mg total) by mouth 2 (two) times daily. 09/15/22  Yes Rinaldo Ratel, [place] N, FNP  Norethindrone Acetate-Ethinyl Estrad-FE (LOESTRIN 24 FE) 1-20 MG-MCG(24) tablet Take 1 tablet by mouth daily. 07/02/21   [place] Bing, MD  prenatal vitamin w/FE, FA (PRENATAL 1  + 1) 27-1 MG TABS tablet Take 1 tablet by mouth daily at 12 noon.    [provider]    Family History Family History  Problem Relation Age of Onset   Breast cancer Mother    Vision loss Father     Social History Social History   Tobacco Use   Smoking status: Never   Smokeless tobacco: Never  Vaping Use   Vaping status: Former   Start date: 08/23/2020  Substance Use Topics   Alcohol use: Yes    Alcohol/week: 1.0 standard drink of alcohol    Types: 1 Standard drinks or equivalent per week    Comment: occasional   Drug use: Not Currently    Types: Marijuana    Comment: Stopped approx 1-2 months ago (03/31/20)     Allergies   Patient has no known allergies.   Review of Systems Review of Systems  Constitutional:  Negative for fatigue and fever.  Gastrointestinal:  Positive for abdominal pain. Negative for nausea and vomiting.  Genitourinary:  Positive for hematuria. Negative for dysuria, vaginal bleeding, vaginal discharge and vaginal pain.  Musculoskeletal:  Positive for back pain.     Physical Exam  Triage Vital Signs ED Triage Vitals  Encounter Vitals Group     BP 09/15/22 1614 99/65     Systolic BP Percentile --      Diastolic BP Percentile --      Pulse Rate 09/15/22 1614 66     Resp --      Temp 09/15/22 1614 99.2 F (37.3 C)     Temp Source 09/15/22 1614 Oral     SpO2 09/15/22 1614 97 %     Weight 09/15/22 1613 170 lb (77.1 kg)     Height 09/15/22 1613 5\' 6"  (1.676 m)     Head Circumference --      Peak Flow --      Pain Score 09/15/22 1613 4     Pain Loc --      Pain Education --      Exclude from Growth Chart --    No data found.  Updated Vital Signs BP 99/65 (BP Location: Left Arm)   Pulse 66   Temp 99.2 F (37.3 C) (Oral)   Ht 5\' 6"  (1.676 m)   Wt 170 lb (77.1 kg)   LMP 09/03/2022   SpO2 97%   BMI 27.44 kg/m   Visual Acuity Right Eye Distance:   Left Eye Distance:   Bilateral Distance:    Right Eye Near:   Left Eye Near:     Bilateral Near:     Physical Exam Vitals and nursing note reviewed.  Constitutional:      Appearance: Normal appearance.  HENT:     Head: Normocephalic and atraumatic.     Right Ear: External ear normal.     Left Ear: External ear normal.     Nose: Nose normal.     Mouth/Throat:     Mouth: Mucous membranes are moist.  Eyes:     Conjunctiva/sclera: Conjunctivae normal.  Cardiovascular:     Rate and Rhythm: Normal rate.  Pulmonary:     Effort: Pulmonary effort is normal. No respiratory distress.  Abdominal:     General: Abdomen is flat. Bowel sounds are normal. There is no distension.     Palpations: Abdomen is soft. There is no mass.     Tenderness: There is no abdominal tenderness. There is no right CVA tenderness or left CVA tenderness.     Hernia: No hernia is present.  Musculoskeletal:        General: Normal range of motion.  Skin:    General: Skin is warm and dry.  Neurological:     General: No focal deficit present.     Mental Status: She is alert.  Psychiatric:        Mood and Affect: Mood normal.      UC Treatments / Results  Labs (all labs ordered are listed, but only abnormal results are displayed) Labs Reviewed  POCT URINALYSIS DIP (MANUAL ENTRY) - Abnormal; Notable for the following components:      Result Value   Color, UA orange (*)    Ketones, POC UA moderate (40) (*)    Spec Grav, UA >=1.030 (*)    Blood, UA moderate (*)    All other components within normal limits  URINE CULTURE  POCT URINE PREGNANCY    EKG   Radiology No results found.  Procedures Procedures (including critical care time)  Medications Ordered in UC Medications - No data to display  Initial Impression / Assessment and Plan / UC Course  I have reviewed the triage vital signs and  the nursing notes.  Pertinent labs & imaging results that were available during my care of the patient were reviewed by me and considered in my medical decision making (see chart for  details).  Vitals and triage reviewed, patient is hemodynamically stable.  Negative for CVA tenderness, fever, nausea, vomiting or concerns for pyelonephritis.  Hematuria that started yesterday, evident on urinalysis.  Will send urine for culture and start on Macrobid for acute cystitis with hematuria.  Plan of care, follow-up care and return precautions given, no questions at this time.     Final Clinical Impressions(s) / UC Diagnoses   Final diagnoses:  Acute cystitis with hematuria     Discharge Instructions      Your urine shows that you have a urinary tract infection, please take all antibiotics as prescribed and until finished, you can take them with food to help prevent gastrointestinal upset.  Ensure you are drinking at least 64 ounces of water, you can add an electrolyte solution like Pedialyte or liquid IV as well.   We are sending her urine out for culture and we will contact you if we need to modify your antibiotic therapy.  Return to clinic for any new or urgent symptoms.      ED Prescriptions     Medication Sig Dispense Auth. Provider   nitrofurantoin, macrocrystal-monohydrate, (MACROBID) 100 MG capsule Take 1 capsule (100 mg total) by mouth 2 (two) times daily. 10 capsule Kieran Arreguin, [place] N, FNP      PDMP not reviewed this encounter.   Bethan Adamek, [place] N, [place] 09/15/22 [phone]

## 2022-09-15 NOTE — Discharge Instructions (Addendum)
Your urine shows that you have a urinary tract infection, please take all antibiotics as prescribed and until finished, you can take them with food to help prevent gastrointestinal upset.  Ensure you are drinking at least 64 ounces of water, you can add an electrolyte solution like Pedialyte or liquid IV as well.   We are sending her urine out for culture and we will contact you if we need to modify your antibiotic therapy.  Return to clinic for any new or urgent symptoms.

## 2022-09-16 DIAGNOSIS — Z419 Encounter for procedure for purposes other than remedying health state, unspecified: Secondary | ICD-10-CM | POA: Diagnosis not present

## 2022-09-17 LAB — URINE CULTURE

## 2022-10-16 DIAGNOSIS — Z419 Encounter for procedure for purposes other than remedying health state, unspecified: Secondary | ICD-10-CM | POA: Diagnosis not present

## 2022-11-16 DIAGNOSIS — Z419 Encounter for procedure for purposes other than remedying health state, unspecified: Secondary | ICD-10-CM | POA: Diagnosis not present

## 2022-12-14 IMAGING — US US ABDOMEN LIMITED
1 series · 14 of 25 positions shown · non-contrast
Comparison: None.

CLINICAL DATA: Right upper quadrant abdomen pain

EXAM:
ULTRASOUND ABDOMEN LIMITED RIGHT UPPER QUADRANT

[Series 1: us abdomen limited · 14 of 48 slices shown]
[im 1/48]
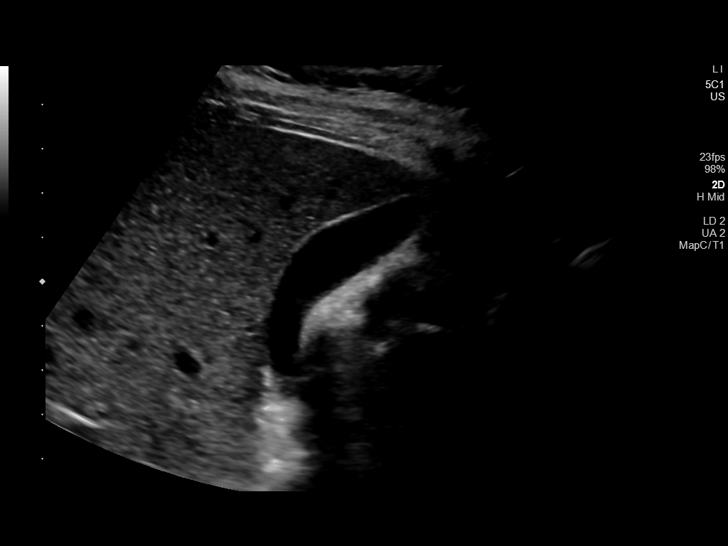
[im 4/48]
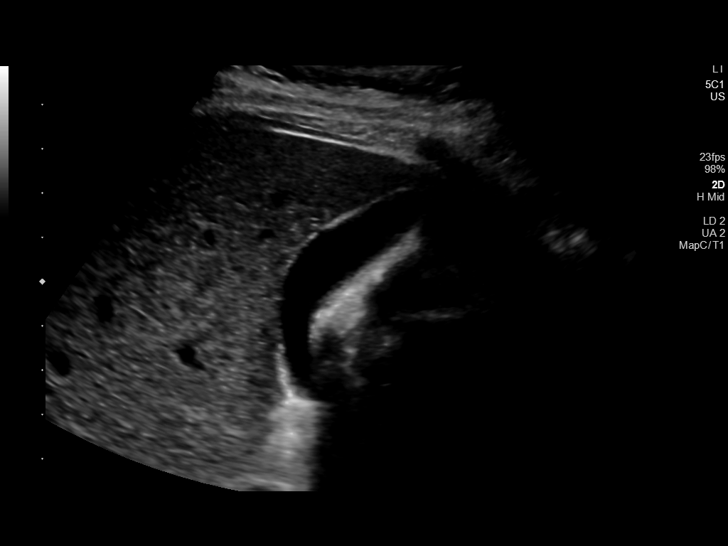
[im 8/48]
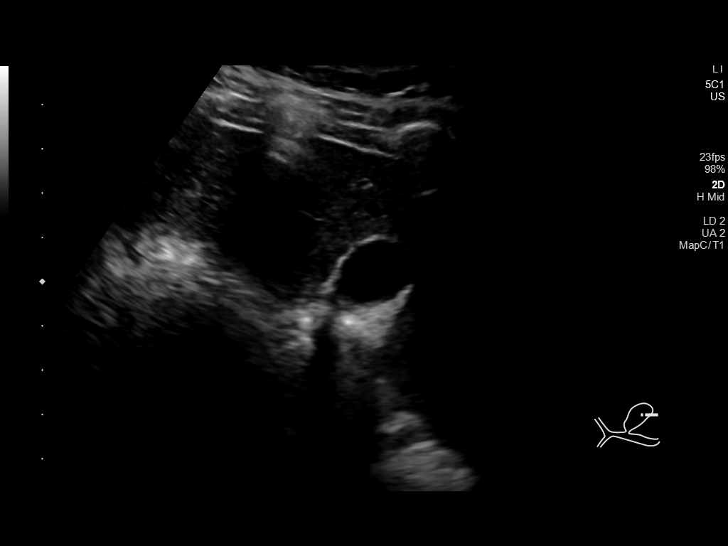
[im 12/48]
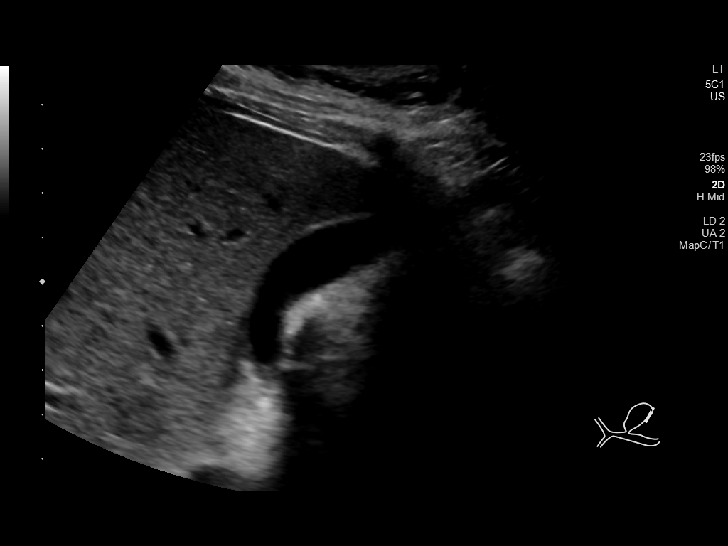
[im 16/48]
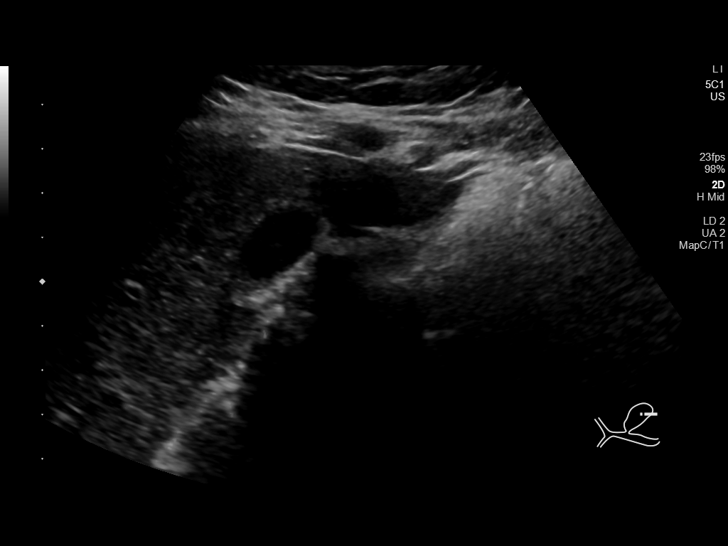
[im 18/48]
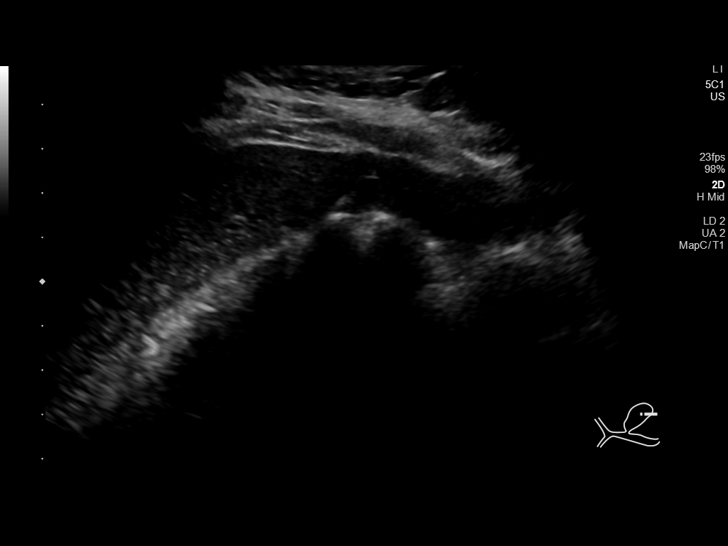
[im 22/48]
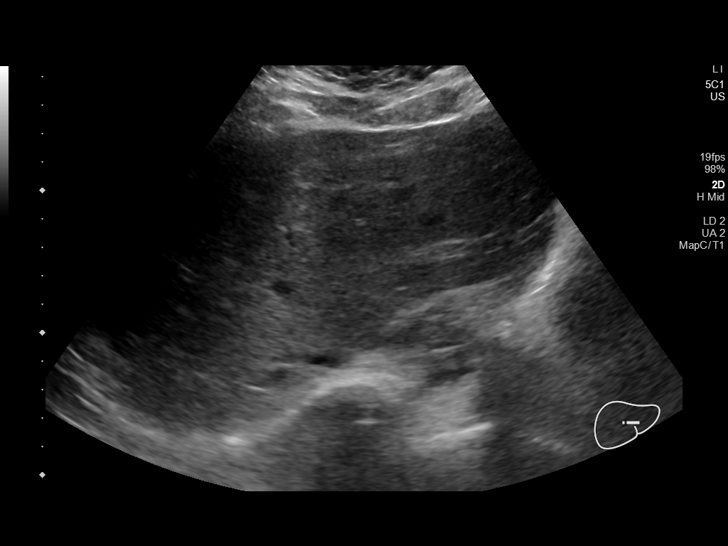
[im 26/48]
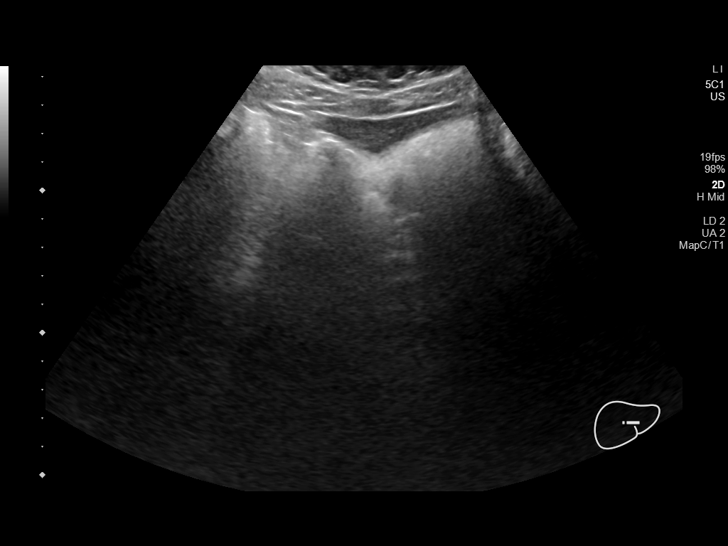
[im 30/48]
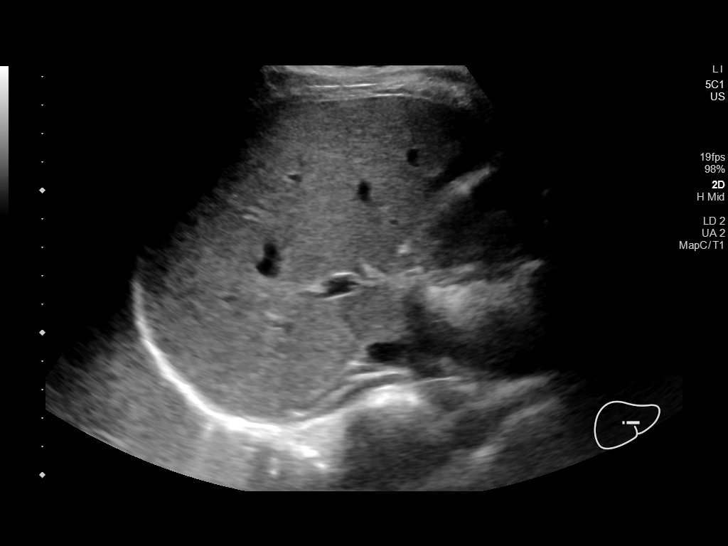
[im 32/48]
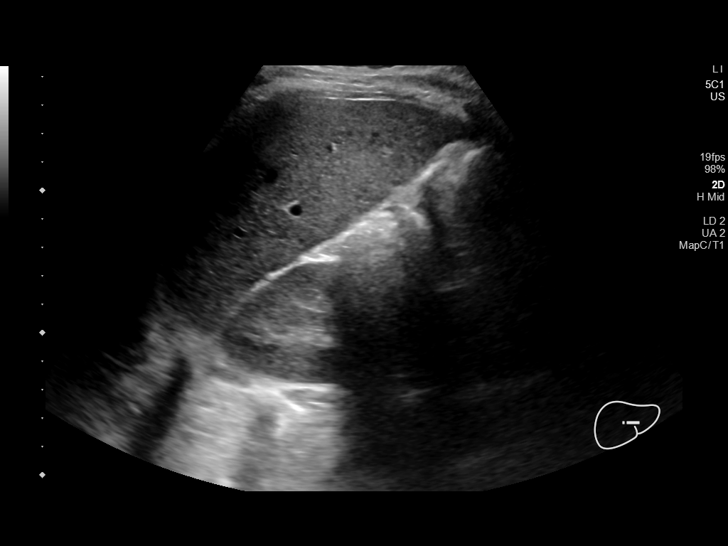
[im 36/48]
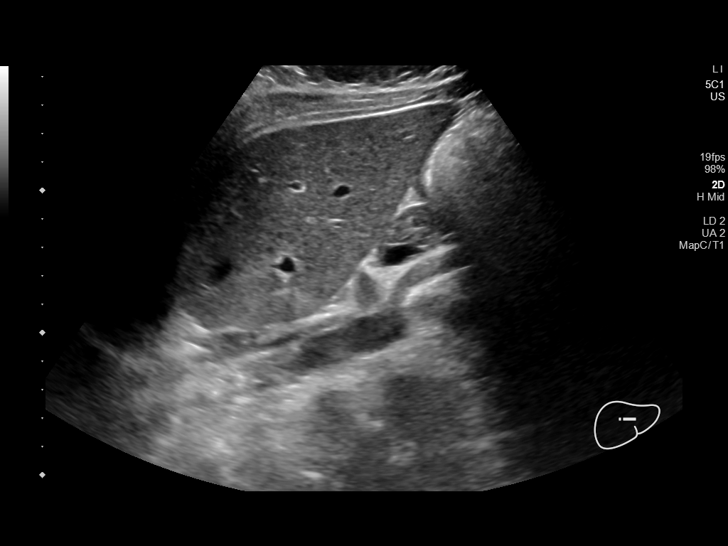
[im 40/48]
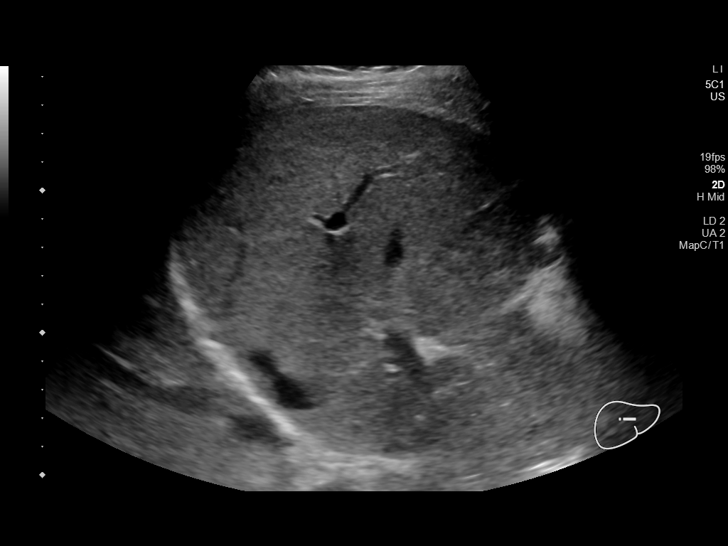
[im 44/48]
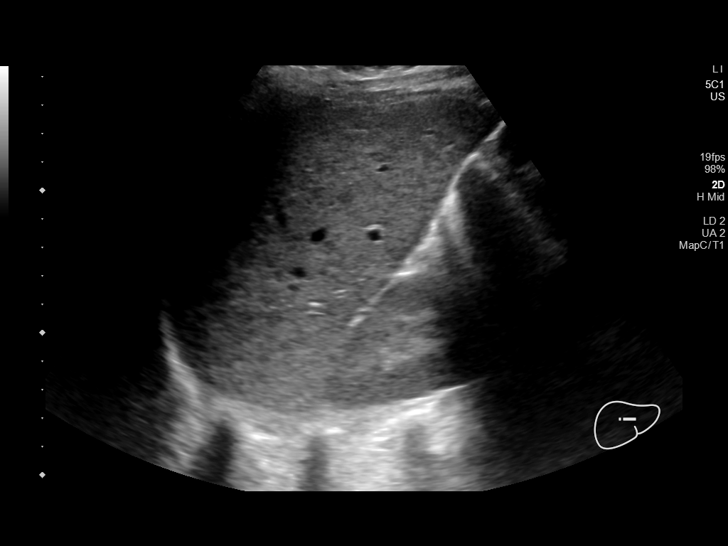
[im 48/48]
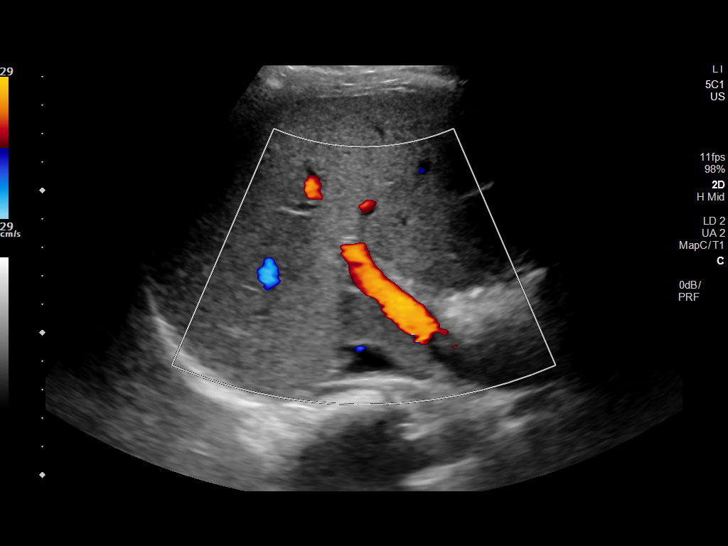

[14 of 25 positions shown; findings below may reference images not displayed]

FINDINGS: Gallbladder:

No gallstones or wall thickening visualized. No sonographic Murphy
sign noted by sonographer.

Common bile duct:

Diameter: 2.4 mm

Liver:

No focal lesion identified. Within normal limits in parenchymal
echogenicity. Portal vein is patent on color Doppler imaging with
normal direction of blood flow towards the liver.

Other: None.
IMPRESSION: Normal right upper quadrant ultrasound. No sonographic evidence of
acute cholecystitis.

## 2022-12-16 DIAGNOSIS — Z419 Encounter for procedure for purposes other than remedying health state, unspecified: Secondary | ICD-10-CM | POA: Diagnosis not present

## 2023-01-16 DIAGNOSIS — Z419 Encounter for procedure for purposes other than remedying health state, unspecified: Secondary | ICD-10-CM | POA: Diagnosis not present

## 2023-02-16 DIAGNOSIS — Z419 Encounter for procedure for purposes other than remedying health state, unspecified: Secondary | ICD-10-CM | POA: Diagnosis not present

## 2023-03-12 ENCOUNTER — Emergency Department (HOSPITAL_COMMUNITY)
Admission: EM | Admit: 2023-03-12 | Discharge: 2023-03-12 | Discharge status: Against Medical Advice | Payer: Medicaid Other | Attending: Emergency Medicine | Admitting: Emergency Medicine

## 2023-03-12 ENCOUNTER — Ambulatory Visit: Payer: Self-pay

## 2023-03-12 DIAGNOSIS — R109 Unspecified abdominal pain: Secondary | ICD-10-CM | POA: Diagnosis not present

## 2023-03-12 DIAGNOSIS — R112 Nausea with vomiting, unspecified: Secondary | ICD-10-CM | POA: Insufficient documentation

## 2023-03-12 DIAGNOSIS — Z5321 Procedure and treatment not carried out due to patient leaving prior to being seen by health care provider: Secondary | ICD-10-CM | POA: Insufficient documentation

## 2023-03-12 LAB — CBC WITH DIFFERENTIAL/PLATELET
Abs Immature Granulocytes: 0.06 10*3/uL (ref 0.00–0.07)
Basophils Absolute: 0 10*3/uL (ref 0.0–0.1)
Basophils Relative: 0 %
Eosinophils Absolute: 0 10*3/uL (ref 0.0–0.5)
Eosinophils Relative: 0 %
HCT: 42 % (ref 36.0–46.0)
Hemoglobin: 13.6 g/dL (ref 12.0–15.0)
Immature Granulocytes: 0 %
Lymphocytes Relative: 7 %
Lymphs Abs: 1 10*3/uL (ref 0.7–4.0)
MCH: 27.6 pg (ref 26.0–34.0)
MCHC: 32.4 g/dL (ref 30.0–36.0)
MCV: 85.2 fL (ref 80.0–100.0)
Monocytes Absolute: 0.5 10*3/uL (ref 0.1–1.0)
Monocytes Relative: 3 %
Neutro Abs: 12.9 10*3/uL — ABNORMAL HIGH (ref 1.7–7.7)
Neutrophils Relative %: 90 %
Platelets: 235 10*3/uL (ref 150–400)
RBC: 4.93 MIL/uL (ref 3.87–5.11)
RDW: 13.1 % (ref 11.5–15.5)
WBC: 14.4 10*3/uL — ABNORMAL HIGH (ref 4.0–10.5)
nRBC: 0 % (ref 0.0–0.2)

## 2023-03-12 LAB — BASIC METABOLIC PANEL
Anion gap: 11 (ref 5–15)
BUN: 14 mg/dL (ref 6–20)
CO2: 21 mmol/L — ABNORMAL LOW (ref 22–32)
Calcium: 9.3 mg/dL (ref 8.9–10.3)
Chloride: 105 mmol/L (ref 98–111)
Creatinine, Ser: 0.62 mg/dL (ref 0.44–1.00)
GFR, Estimated: >60 mL/min (ref >60)
Glucose, Bld: 97 mg/dL (ref 70–99)
Potassium: 4 mmol/L (ref 3.5–5.1)
Sodium: 137 mmol/L (ref 135–145)

## 2023-03-12 LAB — HCG, SERUM, QUALITATIVE: Preg, Serum: NEGATIVE

## 2023-03-12 MED ORDER — ONDANSETRON 4 MG PO TBDP
4.0000 mg | ORAL_TABLET | Freq: Once | ORAL | Status: AC
  Administered 2023-03-12: 4 mg via ORAL
  Filled 2023-03-12: qty 1

## 2023-03-12 NOTE — ED Provider Triage Note (Signed)
 Emergency Medicine Provider Triage Evaluation Note  Kelly Meadows , a 23 y.o. female  was evaluated in triage.  Pt complains of n/v starting this AM. No other complaints. LMP unknown, was "last month, end of the month." Sexually active. Son has cold but no other sick contacts.   Denies fevers, vision changes, cough, congestion, chest pain, shortness of breath, abdominal pain, diarrhea, melena, hematochezia, dysuria, vaginal discharge, rashes, LE swelling.  Review of Systems  Positive: N/a Negative: N/a  Physical Exam  BP 125/82 (BP Location: Left Arm)   Pulse 92   Temp 98.1 F (36.7 C) (Oral)   Resp 16   LMP 05/07/2022   SpO2 96%  Gen:   Awake, no distress   Resp:  Normal effort  MSK:   Moves extremities without difficulty  Other:    Medical Decision Making  Medically screening exam initiated at 8:57 AM.  Appropriate orders placed.  Kristie A Glore was informed that the remainder of the evaluation will be completed by another provider, this initial triage assessment does not replace that evaluation, and the importance of remaining in the ED until their evaluation is complete.     Lunette Stands, [place] 03/12/23 [phone]

## 2023-03-12 NOTE — ED Notes (Signed)
Pt given specimen cup. 

## 2023-03-12 NOTE — ED Triage Notes (Signed)
 Pt states she has been having episodes of bilious emesis that started this morning. No suspicious food intake per pt. Pt has sick contact with her son. LMP approximately four weeks ago.

## 2023-03-16 DIAGNOSIS — Z419 Encounter for procedure for purposes other than remedying health state, unspecified: Secondary | ICD-10-CM | POA: Diagnosis not present

## 2023-04-04 ENCOUNTER — Ambulatory Visit
Admission: EM | Admit: 2023-04-04 | Discharge: 2023-04-04 | Disposition: A | Discharge status: Home / Self Care | Attending: Family Medicine | Admitting: Family Medicine

## 2023-04-04 DIAGNOSIS — N760 Acute vaginitis: Secondary | ICD-10-CM

## 2023-04-04 LAB — POCT URINALYSIS DIP (MANUAL ENTRY)
Bilirubin, UA: NEGATIVE
Blood, UA: NEGATIVE
Glucose, UA: NEGATIVE mg/dL
Ketones, POC UA: NEGATIVE mg/dL
Leukocytes, UA: NEGATIVE
Nitrite, UA: NEGATIVE
Protein Ur, POC: NEGATIVE mg/dL
Spec Grav, UA: >=1.03 — AB (ref 1.010–1.025)
Urobilinogen, UA: 0.2 U/dL
pH, UA: 5.5 (ref 5.0–8.0)

## 2023-04-04 LAB — POCT URINE PREGNANCY: Preg Test, Ur: NEGATIVE

## 2023-04-04 MED ORDER — METRONIDAZOLE 500 MG PO TABS: 500.0000 mg | ORAL_TABLET | Freq: Two times a day (BID) | ORAL | 0 refills | Status: AC

## 2023-04-04 NOTE — ED Triage Notes (Signed)
 Patient reports vaginal spotting x 1 week. Denies any other symptoms. Patient would like STI-testing.

## 2023-04-04 NOTE — ED Provider Notes (Signed)
 [facility]    CSN: [id number] Arrival date & time: 04/04/23  0840      History   Chief Complaint No chief complaint on file.   HPI Kelly Meadows is a 23 y.o. female.   HPI Here for vaginal spotting, vaginal odor and discharge.  Symptoms began about a week ago.  No fever or nausea or vomiting.  No dysuria and no vaginal itching.  Last normal menstrual cycle began March 1.  She has had some pelvic pressure but not really any cramping.  The pressure comes and goes and seems to bother her more after exercising.   NKDA  Past Medical History:  Diagnosis Date   Alpha thalassemia silent carrier 11/22/2020   [ ]  Needs FOB testing and genetic counseling   GBS (group B Streptococcus carrier), +RV culture, currently pregnant 04/29/2021   Medical history non-contributory    Trichomoniasis     There are no active problems to display for this patient.   Past Surgical History:  Procedure Laterality Date   NO PAST SURGERIES      OB History     Gravida  3   Para  1   Term  1   Preterm      AB  1   Living  1      SAB  1   IAB      Ectopic      Multiple  0   Live Births  1            Home Medications    Prior to Admission medications   Medication Sig Start Date End Date Taking? Authorizing Provider  metroNIDAZOLE (FLAGYL) 500 MG tablet Take 1 tablet (500 mg total) by mouth 2 (two) times daily for 7 days. 04/04/23 04/11/23 Yes Markevious Ehmke, Janace Aris, MD    Family History Family History  Problem Relation Age of Onset   Breast cancer Mother    Vision loss Father     Social History Social History   Tobacco Use   Smoking status: Never   Smokeless tobacco: Never  Vaping Use   Vaping status: Former   Start date: 08/23/2020  Substance Use Topics   Alcohol use: Yes    Alcohol/week: 1.0 standard drink of alcohol    Types: 1 Standard drinks or equivalent per week    Comment: occasional   Drug use: Not Currently    Types: Marijuana     Comment: Stopped approx 1-2 months ago (03/31/20)     Allergies   Patient has no known allergies.   Review of Systems Review of Systems   Physical Exam Triage Vital Signs ED Triage Vitals [04/04/23 0854]  Encounter Vitals Group     BP 108/70     Systolic BP Percentile      Diastolic BP Percentile      Pulse Rate 78     Resp 16     Temp 98 F (36.7 C)     Temp Source Oral     SpO2 98 %     Weight      Height      Head Circumference      Peak Flow      Pain Score      Pain Loc      Pain Education      Exclude from Growth Chart    No data found.  Updated Vital Signs BP 108/70 (BP Location: Left Arm)   Pulse 78   Temp  98 F (36.7 C) (Oral)   Resp 16   LMP 03/16/2023 (Approximate)   SpO2 98%   Visual Acuity Right Eye Distance:   Left Eye Distance:   Bilateral Distance:    Right Eye Near:   Left Eye Near:    Bilateral Near:     Physical Exam Vitals reviewed.  Constitutional:      General: She is not in acute distress.    Appearance: She is not ill-appearing, toxic-appearing or diaphoretic.  HENT:     Mouth/Throat:     Mouth: Mucous membranes are moist.  Eyes:     Extraocular Movements: Extraocular movements intact.     Conjunctiva/sclera: Conjunctivae normal.     Pupils: Pupils are equal, round, and reactive to light.  Cardiovascular:     Rate and Rhythm: Normal rate and regular rhythm.     Heart sounds: No murmur heard. Pulmonary:     Effort: Pulmonary effort is normal.     Breath sounds: Normal breath sounds.  Abdominal:     General: There is no distension.     Palpations: Abdomen is soft.     Tenderness: There is no abdominal tenderness. There is no guarding.  Musculoskeletal:     Cervical back: Neck supple.  Lymphadenopathy:     Cervical: No cervical adenopathy.  Skin:    Coloration: Skin is not pale.  Neurological:     General: No focal deficit present.     Mental Status: She is alert and oriented to person, place, and time.   Psychiatric:        Behavior: Behavior normal.      UC Treatments / Results  Labs (all labs ordered are listed, but only abnormal results are displayed) Labs Reviewed  POCT URINALYSIS DIP (MANUAL ENTRY) - Abnormal; Notable for the following components:      Result Value   Spec Grav, UA >=1.030 (*)    All other components within normal limits  POCT URINE PREGNANCY  CERVICOVAGINAL ANCILLARY ONLY    EKG   Radiology No results found.  Procedures Procedures (including critical care time)  Medications Ordered in UC Medications - No data to display  Initial Impression / Assessment and Plan / UC Course  I have reviewed the triage vital signs and the nursing notes.  Pertinent labs & imaging results that were available during my care of the patient were reviewed by me and considered in my medical decision making (see chart for details).     Urinalysis is concentrated but does not have any leukocytes, RBCs, or nitrites.  Urine pregnancy test is negative.  Vaginal self swab is done, and we will notify of any positives on that and treat per protocol.  We discussed options and she wishes to be treated empirically.  She does have a history of trichomonas.  Metronidazole was sent in to treat potential BV or other vaginitis. Final Clinical Impressions(s) / UC Diagnoses   Final diagnoses:  Acute vaginitis     Discharge Instructions      Staff will notify you if there is anything positive on the swab.  Take metronidazole 500 mg--1 tablet 2 times daily for 7 days.  Avoid drinking alcohol within 72 hours of taking this medication      ED Prescriptions     Medication Sig Dispense Auth. Provider   metroNIDAZOLE (FLAGYL) 500 MG tablet Take 1 tablet (500 mg total) by mouth 2 (two) times daily for 7 days. 14 tablet Gail Creekmore, Janace Aris, MD  PDMP not reviewed this encounter.   Zenia Resides, MD 04/04/23 [phone]

## 2023-04-04 NOTE — Discharge Instructions (Signed)
Staff will notify you if there is anything positive on the swab  Take metronidazole 500 mg--1 tablet 2 times daily for 7 days.  Avoid drinking alcohol within 72 hours of taking this medication

## 2023-04-05 LAB — CERVICOVAGINAL ANCILLARY ONLY
Bacterial Vaginitis (gardnerella): POSITIVE — AB
Candida Glabrata: NEGATIVE
Candida Vaginitis: NEGATIVE
Chlamydia: NEGATIVE
Comment: NEGATIVE
Comment: NEGATIVE
Comment: NEGATIVE
Comment: NEGATIVE
Comment: NEGATIVE
Comment: NORMAL
Neisseria Gonorrhea: NEGATIVE
Trichomonas: NEGATIVE

## 2023-04-27 DIAGNOSIS — Z419 Encounter for procedure for purposes other than remedying health state, unspecified: Secondary | ICD-10-CM | POA: Diagnosis not present

## 2023-05-27 DIAGNOSIS — Z419 Encounter for procedure for purposes other than remedying health state, unspecified: Secondary | ICD-10-CM | POA: Diagnosis not present

## 2023-06-12 ENCOUNTER — Ambulatory Visit
Admission: EM | Admit: 2023-06-12 | Discharge: 2023-06-12 | Disposition: A | Discharge status: Home / Self Care | Attending: Family Medicine | Admitting: Family Medicine

## 2023-06-12 DIAGNOSIS — N760 Acute vaginitis: Secondary | ICD-10-CM

## 2023-06-12 DIAGNOSIS — B9689 Other specified bacterial agents as the cause of diseases classified elsewhere: Secondary | ICD-10-CM

## 2023-06-12 MED ORDER — METRONIDAZOLE 500 MG PO TABS: 500.0000 mg | ORAL_TABLET | Freq: Two times a day (BID) | ORAL | 0 refills | Status: DC

## 2023-06-12 MED ORDER — FLUCONAZOLE 150 MG PO TABS: 150.0000 mg | ORAL_TABLET | ORAL | 0 refills | Status: DC

## 2023-06-12 NOTE — ED Provider Notes (Signed)
 [facility]  Note:  This document was prepared using Conservation officer, historic buildings and may include unintentional dictation errors.  MRN: [id number] DOB: [birth year]  Subjective:   Kelly Meadows is a 23 y.o. female presenting for 2 day history of recurrent vaginal discharge, malodor. Has concerns for recurring bacterial vaginosis.  Would like empiric treatment.  Denies fever, n/v, abdominal pain, pelvic pain, rashes, dysuria, urinary frequency, hematuria.  Would also like STI testing.  No chance pregnancy per patient.  Declines UPT.  No current facility-administered medications for this encounter. No current outpatient medications on file.   No Known Allergies  Past Medical History:  Diagnosis Date   Alpha thalassemia silent carrier 11/22/2020   [ ]  Needs FOB testing and genetic counseling   GBS (group B Streptococcus carrier), +RV culture, currently pregnant 04/29/2021   Medical history non-contributory    Trichomoniasis      Past Surgical History:  Procedure Laterality Date   NO PAST SURGERIES      Family History  Problem Relation Age of Onset   Breast cancer Mother    Vision loss Father     Social History   Tobacco Use   Smoking status: Never   Smokeless tobacco: Never  Vaping Use   Vaping status: Former   Start date: 08/23/2020  Substance Use Topics   Alcohol use: Not Currently    Comment: occasional   Drug use: Not Currently    Types: Marijuana    ROS   Objective:   Vitals: BP 111/76 (BP Location: Right Arm)   Pulse 75   Temp 97.8 F (36.6 C) (Oral)   Resp 16   LMP 05/29/2023   SpO2 98%   Physical Exam Constitutional:      General: She is not in acute distress.    Appearance: Normal appearance. She is well-developed. She is not ill-appearing, toxic-appearing or diaphoretic.  HENT:     Head: Normocephalic and atraumatic.     Nose: Nose normal.     Mouth/Throat:     Mouth: Mucous membranes are moist.     Pharynx:  Oropharynx is clear.  Eyes:     General: No scleral icterus.       Right eye: No discharge.        Left eye: No discharge.     Extraocular Movements: Extraocular movements intact.     Conjunctiva/sclera: Conjunctivae normal.  Cardiovascular:     Rate and Rhythm: Normal rate.  Pulmonary:     Effort: Pulmonary effort is normal.  Abdominal:     General: Bowel sounds are normal. There is no distension.     Palpations: Abdomen is soft. There is no mass.     Tenderness: There is no abdominal tenderness. There is no right CVA tenderness, left CVA tenderness, guarding or rebound.  Skin:    General: Skin is warm and dry.  Neurological:     General: No focal deficit present.     Mental Status: She is alert and oriented to person, place, and time.  Psychiatric:        Mood and Affect: Mood normal.        Behavior: Behavior normal.        Thought Content: Thought content normal.        Judgment: Judgment normal.     Assessment and Plan :   PDMP not reviewed this encounter.  1. Bacterial vaginosis   2. Acute vaginitis    We will treat patient  empirically for bacterial vaginosis with Flagyl  and for yeast vaginitis with fluconazole .  Labs pending. Counseled patient on potential for adverse effects with medications prescribed/recommended today, ER and return-to-clinic precautions discussed, patient verbalized understanding.    Adolph Hoop, [place] 06/12/23 [phone]

## 2023-06-12 NOTE — ED Triage Notes (Signed)
 Pt c/o vaginal d/c, irritation and odor x 2 days-NAD-steady gait

## 2023-06-13 LAB — CERVICOVAGINAL ANCILLARY ONLY
Bacterial Vaginitis (gardnerella): POSITIVE — AB
Candida Glabrata: NEGATIVE
Candida Vaginitis: POSITIVE — AB
Chlamydia: NEGATIVE
Comment: NEGATIVE
Comment: NEGATIVE
Comment: NEGATIVE
Comment: NEGATIVE
Comment: NEGATIVE
Comment: NORMAL
Neisseria Gonorrhea: NEGATIVE
Trichomonas: NEGATIVE

## 2023-06-14 ENCOUNTER — Ambulatory Visit: Payer: Self-pay

## 2023-06-27 DIAGNOSIS — Z419 Encounter for procedure for purposes other than remedying health state, unspecified: Secondary | ICD-10-CM | POA: Diagnosis not present

## 2023-07-27 DIAGNOSIS — Z419 Encounter for procedure for purposes other than remedying health state, unspecified: Secondary | ICD-10-CM | POA: Diagnosis not present

## 2023-08-27 DIAGNOSIS — Z419 Encounter for procedure for purposes other than remedying health state, unspecified: Secondary | ICD-10-CM | POA: Diagnosis not present

## 2023-09-09 ENCOUNTER — Telehealth: Payer: Self-pay | Admitting: Family Medicine

## 2023-09-09 NOTE — Telephone Encounter (Signed)
 Please disregard this error

## 2023-09-27 DIAGNOSIS — Z419 Encounter for procedure for purposes other than remedying health state, unspecified: Secondary | ICD-10-CM | POA: Diagnosis not present

## 2023-11-20 ENCOUNTER — Encounter (HOSPITAL_COMMUNITY): Payer: Self-pay

## 2023-11-20 ENCOUNTER — Other Ambulatory Visit: Payer: Self-pay

## 2023-11-20 ENCOUNTER — Inpatient Hospital Stay (HOSPITAL_COMMUNITY)
Admission: AD | Admit: 2023-11-20 | Discharge: 2023-11-20 | Disposition: A | Discharge status: Home / Self Care | Attending: Obstetrics & Gynecology | Admitting: Obstetrics & Gynecology

## 2023-11-20 DIAGNOSIS — N949 Unspecified condition associated with female genital organs and menstrual cycle: Secondary | ICD-10-CM | POA: Insufficient documentation

## 2023-11-20 DIAGNOSIS — N898 Other specified noninflammatory disorders of vagina: Secondary | ICD-10-CM

## 2023-11-20 DIAGNOSIS — Z3A17 17 weeks gestation of pregnancy: Secondary | ICD-10-CM | POA: Diagnosis not present

## 2023-11-20 DIAGNOSIS — R109 Unspecified abdominal pain: Secondary | ICD-10-CM | POA: Diagnosis present

## 2023-11-20 DIAGNOSIS — O26892 Other specified pregnancy related conditions, second trimester: Secondary | ICD-10-CM

## 2023-11-20 DIAGNOSIS — Z113 Encounter for screening for infections with a predominantly sexual mode of transmission: Secondary | ICD-10-CM | POA: Diagnosis present

## 2023-11-20 DIAGNOSIS — O26899 Other specified pregnancy related conditions, unspecified trimester: Secondary | ICD-10-CM

## 2023-11-20 LAB — WET PREP, GENITAL
Clue Cells Wet Prep HPF POC: NONE SEEN
Sperm: NONE SEEN
Trich, Wet Prep: NONE SEEN
WBC, Wet Prep HPF POC: <10 (ref <10)
Yeast Wet Prep HPF POC: NONE SEEN

## 2023-11-20 LAB — URINALYSIS, ROUTINE W REFLEX MICROSCOPIC
Bacteria, UA: NONE SEEN
Bilirubin Urine: NEGATIVE
Glucose, UA: NEGATIVE mg/dL
Hgb urine dipstick: NEGATIVE
Ketones, ur: NEGATIVE mg/dL
Nitrite: NEGATIVE
Protein, ur: NEGATIVE mg/dL
Specific Gravity, Urine: 1.019 (ref 1.005–1.030)
pH: 6 (ref 5.0–8.0)

## 2023-11-20 NOTE — MAU Provider Note (Signed)
 S [name] is a 23 y.o. [phone] patient who presents to MAU today with complaint of vaginal discharge and light abdominal cramping.  Patient reports she is [redacted] weeks pregnant.  She denies any vaginal bleeding, leaking of fluid at this time and reports she is feeling fetal movements.  Patient is a patient of [facility] and has established her prenatal care with them and reports that she has an appointment with them tomorrow   O BP 105/66 (BP Location: Right Arm)   Temp 98.4 F (36.9 C)   Resp 18   SpO2 100%  Physical Exam Constitutional:      General: She is not in acute distress.    Appearance: She is well-developed. She is obese. She is not ill-appearing.  HENT:     Head: Normocephalic.     Nose: Nose normal.     Mouth/Throat:     Mouth: Mucous membranes are moist.  Cardiovascular:     Rate and Rhythm: Normal rate.  Pulmonary:     Effort: Pulmonary effort is normal.  Abdominal:     Palpations: Abdomen is soft.     Tenderness: There is no abdominal tenderness.  Musculoskeletal:     Cervical back: Normal range of motion.  Skin:    General: Skin is warm.  Neurological:     Mental Status: She is alert and oriented to person, place, and time.  Psychiatric:        Behavior: Behavior normal.     MDM  MODERATE  Available prenatal records reviewed This is physical exam performed Wet prep: Negative UA: No evidence of UTI GC/chlamydia culture pending at discharge  Likely normal physiological discharge of pregnancy and round ligament pain.  Patient advised to follow-up with primary OB and consider an abdominal support band for pregnancy    Orders Placed This Encounter  Procedures   Wet prep, genital    Standing Status:   Standing    Number of Occurrences:   1   Urinalysis, Routine w reflex microscopic -Urine, Clean Catch    Standing Status:   Standing    Number of Occurrences:   1    Specimen Source:   Urine, Clean Catch [76]   Discharge  patient Discharge disposition: 01-Home or Self Care; Discharge patient date: 11/20/2023    Standing Status:   Standing    Number of Occurrences:   1    Discharge disposition:   01-Home or Self Care [1]    Discharge patient date:   11/20/2023      Results for orders placed or performed during the hospital encounter of 11/20/23 (from the past 24 hours)  Wet prep, genital     Status: None   Collection Time: 11/20/23 10:27 AM   Specimen: PATH Cytology Cervicovaginal Ancillary Only  Result Value Ref Range   Yeast Wet Prep HPF POC NONE SEEN NONE SEEN   Trich, Wet Prep NONE SEEN NONE SEEN   Clue Cells Wet Prep HPF POC NONE SEEN NONE SEEN   WBC, Wet Prep HPF POC <10 <10   Sperm NONE SEEN   Urinalysis, Routine w reflex microscopic -Urine, Clean Catch     Status: Abnormal   Collection Time: 11/20/23 10:27 AM  Result Value Ref Range   Color, Urine YELLOW YELLOW   APPearance CLEAR CLEAR   Specific Gravity, Urine 1.019 1.005 - 1.030   pH 6.0 5.0 - 8.0   Glucose, UA NEGATIVE NEGATIVE mg/dL   Hgb urine dipstick  NEGATIVE NEGATIVE   Bilirubin Urine NEGATIVE NEGATIVE   Ketones, ur NEGATIVE NEGATIVE mg/dL   Protein, ur NEGATIVE NEGATIVE mg/dL   Nitrite NEGATIVE NEGATIVE   Leukocytes,Ua TRACE (A) NEGATIVE   RBC / HPF 0-5 0 - 5 RBC/hpf   WBC, UA 0-5 0 - 5 WBC/hpf   Bacteria, UA NONE SEEN NONE SEEN   Squamous Epithelial / HPF 0-5 0 - 5 /HPF   Mucus PRESENT        I have reviewed the patient chart and performed the physical exam . I have ordered & interpreted the lab results and reviewed them with the patient    A/P as described below.  Counseling and education provided and patient agreeable  with plan as described below. Verbalized understanding.    ASSESSMENT Medical screening exam complete Vaginal discharge during pregnancy, antepartum  [redacted] weeks gestation of pregnancy  Round ligament pain    PLAN   Follow up with your OB provider as scheduled 11/21/23  Discharge from MAU in stable  condition  See AVS for full description of educational information and instructions provided to the patient at time of discharge   Warning signs for worsening condition that would warrant emergency follow-up discussed  Patient may return to MAU as needed   Kelly Meadows LABOR, NP 11/20/2023 12:06 PM   This chart was dictated using voice recognition software, Dragon. Despite the best efforts of this provider to proofread and correct errors, errors may still occur which can change documentation meaning.

## 2023-11-20 NOTE — MAU Note (Signed)
 Kelly Meadows is a 23 y.o. at Unknown here in MAU reporting: green discharge and light abd cramping. Pt reports being 17 weeks preg 04/29/24 EDD. Denies VB  LMP: see edd Onset of complaint: this morning Pain score: 0/10 Vitals:   11/20/23 1007  BP: 105/66  Resp: 18  Temp: 98.4 F (36.9 C)  SpO2: 100%     FHT: 158  Lab orders placed from triage: ua , vag swabs

## 2023-11-20 NOTE — Discharge Instructions (Signed)
 SABRA

## 2023-11-21 LAB — GC/CHLAMYDIA PROBE AMP (~~LOC~~) NOT AT ARMC
Chlamydia: NEGATIVE
Comment: NEGATIVE
Comment: NORMAL
Neisseria Gonorrhea: NEGATIVE

## 2024-02-04 ENCOUNTER — Inpatient Hospital Stay (HOSPITAL_COMMUNITY)
Admission: AD | Admit: 2024-02-04 | Discharge: 2024-02-04 | Disposition: A | Discharge status: Home / Self Care | Attending: Obstetrics and Gynecology | Admitting: Obstetrics and Gynecology

## 2024-02-04 ENCOUNTER — Encounter (HOSPITAL_COMMUNITY): Payer: Self-pay | Admitting: Obstetrics and Gynecology

## 2024-02-04 DIAGNOSIS — Z3689 Encounter for other specified antenatal screening: Secondary | ICD-10-CM | POA: Diagnosis not present

## 2024-02-04 DIAGNOSIS — B9689 Other specified bacterial agents as the cause of diseases classified elsewhere: Secondary | ICD-10-CM | POA: Diagnosis not present

## 2024-02-04 DIAGNOSIS — O26899 Other specified pregnancy related conditions, unspecified trimester: Secondary | ICD-10-CM

## 2024-02-04 DIAGNOSIS — Z3A27 27 weeks gestation of pregnancy: Secondary | ICD-10-CM | POA: Diagnosis not present

## 2024-02-04 DIAGNOSIS — O23592 Infection of other part of genital tract in pregnancy, second trimester: Secondary | ICD-10-CM

## 2024-02-04 LAB — URINALYSIS, ROUTINE W REFLEX MICROSCOPIC
Bilirubin Urine: NEGATIVE
Glucose, UA: NEGATIVE mg/dL
Hgb urine dipstick: NEGATIVE
Ketones, ur: 5 mg/dL — AB
Leukocytes,Ua: NEGATIVE
Nitrite: NEGATIVE
Protein, ur: NEGATIVE mg/dL
Specific Gravity, Urine: 1.032 — ABNORMAL HIGH (ref 1.005–1.030)
pH: 5 (ref 5.0–8.0)

## 2024-02-04 LAB — WET PREP, GENITAL
Sperm: NONE SEEN
Trich, Wet Prep: NONE SEEN
WBC, Wet Prep HPF POC: >=10 — AB
Yeast Wet Prep HPF POC: NONE SEEN

## 2024-02-04 LAB — FETAL FIBRONECTIN: Fetal Fibronectin: NEGATIVE

## 2024-02-04 MED ORDER — METRONIDAZOLE 500 MG PO TABS: 500.0000 mg | ORAL_TABLET | Freq: Two times a day (BID) | ORAL | 0 refills | Status: AC

## 2024-02-04 NOTE — MAU Note (Signed)
" °  Kelly Meadows is a 24 y.o. at [redacted]w[redacted]d here in MAU reporting: when she has a BM there is blood on the tissue. Reports she has hemorrhoids but thinks this pink discharge is coming from her vagina. Pelvic pressure off and on for a while. Denies pain or burning with urination. Reports positive fetal movement  Onset of complaint: 2 days Pain score: 0/10 Vitals:   02/04/24 1911  BP: (!) 110/58  Pulse: 90  Resp: 17  Temp: 98.4 F (36.9 C)  SpO2: (!) 11%     FHT: 144  Lab orders placed from triage: none  "

## 2024-02-04 NOTE — Discharge Instructions (Signed)
 You have an overgrowth of bacteria in your vagina called bacterial vaginosis. This is not a sexually transmitted infection.  Sexual partners do not need to be treated, however abstaining from sex or using condoms may prevent recurrence of the overgrowth. Some women have a recurrence of the overgrowth even when fully treated.  Call the office if your symptoms begin again.  Do not douche as this is associated with decreased cure rates and more bacterial overgrowths. Take the full course of the antibiotic prescribed to you even if you begin to feel better.  VAGINAL BLEEDING DURING PREGNANCY: A small amount of bleeding from the vagina is common during pregnancy. Sometimes the bleeding is normal and does not cause problems. At other times, though, bleeding may be a sign of something serious. SECOND AND THIRD TRIMESTER Normal causes of bleeding during your second trimester are: Having sex. Having pelvic exams. Abnormal causes of bleeding during this time are: A problem with the cervix, such as: Infection or swelling. Growths. Cervical insufficiency. This is when the cervix opens early, before labor. A problem with the placenta. The placenta may: Block the opening of the cervix (placenta previa). Break away from the uterus (placental abruption). Attach too deeply into the uterus (placenta accreta). Early labor.  Your bleeding is likely caused by bacterial vaginosis. We ruled out all other issues!  Reasons to return to MAU at [facility]: Less than 36 weeks: Contractions feels like menstrual cramps. You should go to the hospital if you have more than 6 contractions in an hour, even after you have rested and drank at least 16 ounces of water .  More than 36 weeks: You begin to have strong, frequent contractions 5 minutes apart or less, each last 1 minute, these have been going on for 1-2 hours, and you cannot walk or talk during them. Your water  breaks.  Sometimes it is a  big gush of fluid. However, many times it may it may be much more subtle. You should go to the hospital if you have a constant leakage of fluid from your vagina, enough to soak a pad when you are walking around.  You have vaginal bleeding.  It is normal to have a small amount of spotting if your cervix was checked. If you have bleeding requiring the use of a pad, go to the hospital. You don't feel your baby moving like normal.  If you think that you babys movement is decreased, eat a snack and rest on your left side in a quiet room for one hour. If you have not felt the baby move more than 6 times in an hour GO TO THE HOSPITAL.

## 2024-02-04 NOTE — MAU Provider Note (Signed)
 Chief Complaint:  Vaginal Bleeding   HPI   None     Kelly Meadows is a 24 y.o. G3P1011 at [redacted]w[redacted]d who presents to maternity admissions reporting vaginal bleeding and cramping. She reports intermittent cramping for the past few days, states it is not very strong. She reports that she has a history of hemorrhoids, and usually she sees bright red blood on the toilet tissue when having a bowel movement. Today, she saw pink watery discharge on the toilet paper after a bowel movement, which is different from the blood she sees with hemorrhoids. She also put a small amount of toilet paper into the vaginal canal to see if that was where the blood was from, and reports it came back with the pink discharge. Endorses good fetal movement. Denies leaking of fluid.   Pregnancy Course: Receives care at [facility]. Prenatal records reviewed. No history of preterm labor.  Past Medical History:  Diagnosis Date   Alpha thalassemia silent carrier 11/22/2020   [ ]  Needs FOB testing and genetic counseling   GBS (group B Streptococcus carrier), +RV culture, currently pregnant 04/29/2021   Medical history non-contributory    Trichomoniasis    OB History  Gravida Para Term Preterm AB Living  3 1 1  1 1   SAB IAB Ectopic Multiple Live Births  1   0 1    # Outcome Date GA Lbr Len/2nd Weight Sex Type Anes PTL Lv  3 Current           2 Term 05/18/21 [redacted]w[redacted]d 02:15 / 00:24 3180 g M Vag-Spont EPI  LIV     Birth Comments: wnl  1 SAB            Past Surgical History:  Procedure Laterality Date   NO PAST SURGERIES     Family History  Problem Relation Age of Onset   Breast cancer Mother    Vision loss Father    Social History[1] Allergies[2] No medications prior to admission.    I have reviewed patient's Past Medical Hx, Surgical Hx, Family Hx, Social Hx, medications and allergies.   ROS  Pertinent items noted in HPI and remainder of comprehensive ROS otherwise negative.   PHYSICAL EXAM  Patient  Vitals for the past 24 hrs:  BP Temp Temp src Pulse Resp SpO2 Height Weight  02/04/24 1911 (!) 110/58 98.4 F (36.9 C) Oral 90 17 (!) 11 % 5' 6 (1.676 m) 88.4 kg    Constitutional: Well-developed, well-nourished female in no acute distress.  Cardiovascular: normal rate, warm and well-perfused Respiratory: normal effort, no problems with respiration noted GI: Abd soft, non-tender, non-distended MSK: Extremities nontender, no edema, normal ROM Skin: warm and dry. Acyanotic, no jaundice or pallor. Neurologic: Alert and oriented x 4. No abnormal coordination. Psychiatric: Normal mood. Speech not slurred, not rapid/pressured. Patient is cooperative. Pelvic exam: VULVA: normal appearing vulva with no masses, tenderness or lesions, VAGINA: normal appearing vagina with normal color, no lesions, creamy white discharge streaked with pink, CERVIX: multiparous os, friable cervix, os closed, RECTUM: hemorrhoids but no active bleeding, exam chaperoned by Burnadette Gravel RN.      Fetal Tracing: Baseline FHR: 135 per minute Fetal heart variability: moderate Fetal Heart Rate accelerations: yes Fetal Heart Rate decelerations: none Fetal Non-stress Test: Category I (reactive) Toco: no uterine contractions   Labs: Results for orders placed or performed during the hospital encounter of 02/04/24 (from the past 24 hours)  Urinalysis, Routine w reflex microscopic -Urine, Clean Catch  Status: Abnormal   Collection Time: 02/04/24  7:44 PM  Result Value Ref Range   Color, Urine YELLOW YELLOW   APPearance HAZY (A) CLEAR   Specific Gravity, Urine 1.032 (H) 1.005 - 1.030   pH 5.0 5.0 - 8.0   Glucose, UA NEGATIVE NEGATIVE mg/dL   Hgb urine dipstick NEGATIVE NEGATIVE   Bilirubin Urine NEGATIVE NEGATIVE   Ketones, ur 5 (A) NEGATIVE mg/dL   Protein, ur NEGATIVE NEGATIVE mg/dL   Nitrite NEGATIVE NEGATIVE   Leukocytes,Ua NEGATIVE NEGATIVE  Fetal fibronectin     Status: None   Collection Time: 02/04/24   8:30 PM  Result Value Ref Range   Fetal Fibronectin NEGATIVE NEGATIVE  Wet prep, genital     Status: Abnormal   Collection Time: 02/04/24  8:30 PM  Result Value Ref Range   Yeast Wet Prep HPF POC NONE SEEN NONE SEEN   Trich, Wet Prep NONE SEEN NONE SEEN   Clue Cells Wet Prep HPF POC PRESENT (A) NONE SEEN   WBC, Wet Prep HPF POC >=10 (A) <10   Sperm NONE SEEN     Imaging:  No results found.  MDM & MAU COURSE  MDM: Moderate  MAU Course: -Mild hypotension, otherwise vital signs within normal limits.  -UA with elevated specific gravity. Elevated specific gravity and mild hypotension consistent with dehydration. -Friable cervix on speculum exam. Cervix closed, no bleeding from cervical os so low suspicion for placental pathology. No bleeding from hemorrhoids. -Fetal fibronectin and wet prep collected. -Encouraged increased PO fluid intake while awaiting results for dehydration. -Fetal fibronectin negative. With closed cervix, confirm no preterm labor. -Wet prep positive for BV, will treat with metronidazole .  Differential diagnosis considered for 2nd or 3rd trimester vaginal bleeding includes but is not limited to: preterm labor, placenta previa, vasa previa, placental abruption, infection, marginal separation, cervical or vaginal lesion   Orders Placed This Encounter  Procedures   Wet prep, genital   Urinalysis, Routine w reflex microscopic -Urine, Clean Catch   Fetal fibronectin   Discharge patient   Meds ordered this encounter  Medications   metroNIDAZOLE  (FLAGYL ) 500 MG tablet    Sig: Take 1 tablet (500 mg total) by mouth 2 (two) times daily for 7 days.    Dispense:  14 tablet    Refill:  0    ASSESSMENT   1. Bacterial vaginosis in pregnancy   2. NST (non-stress test) reactive   3. [redacted] weeks gestation of pregnancy     PLAN  Discharge home in stable condition with return precautions.  Metronidazole  500 mg BID x7 days for BV.       Allergies as of 02/04/2024   No  Known Allergies      Medication List     TAKE these medications    metroNIDAZOLE  500 MG tablet Commonly known as: FLAGYL  Take 1 tablet (500 mg total) by mouth 2 (two) times daily for 7 days.        Joesph DELENA Sear, PA      [1]  Social History Tobacco Use   Smoking status: Never   Smokeless tobacco: Never  Vaping Use   Vaping status: Former   Start date: 08/23/2020  Substance Use Topics   Alcohol use: Not Currently    Comment: occasional   Drug use: Not Currently    Types: Marijuana  [2] No Known Allergies

## 2024-02-21 NOTE — Progress Notes (Signed)
 Fetal fibronectin collected on 02/04/2024 to rule out preterm labor given complaint of abdominal cramping during pregnancy.
